# Patient Record
Sex: Female | Born: 1950 | Race: Black or African American | Hispanic: No | Marital: Married | Smoking: Current some day smoker
Health system: Southern US, Community
[De-identification: ages and names within clinical notes are randomized; demographics above are authoritative.]

## PROBLEM LIST (undated history)

## (undated) DIAGNOSIS — M1990 Unspecified osteoarthritis, unspecified site: Secondary | ICD-10-CM

## (undated) DIAGNOSIS — G629 Polyneuropathy, unspecified: Secondary | ICD-10-CM

## (undated) DIAGNOSIS — I739 Peripheral vascular disease, unspecified: Secondary | ICD-10-CM

## (undated) DIAGNOSIS — J449 Chronic obstructive pulmonary disease, unspecified: Secondary | ICD-10-CM

## (undated) DIAGNOSIS — M79609 Pain in unspecified limb: Secondary | ICD-10-CM

## (undated) HISTORY — PX: ABDOMINAL HYSTERECTOMY: SHX81

## (undated) HISTORY — DX: Chronic obstructive pulmonary disease, unspecified: J44.9

## (undated) HISTORY — DX: Pain in unspecified limb: M79.609

## (undated) HISTORY — DX: Polyneuropathy, unspecified: G62.9

## (undated) HISTORY — DX: Unspecified osteoarthritis, unspecified site: M19.90

## (undated) HISTORY — PX: OTHER SURGICAL HISTORY: SHX169

## (undated) HISTORY — DX: Peripheral vascular disease, unspecified: I73.9

## (undated) HISTORY — PX: SPINE SURGERY: SHX786

---

## 2007-08-14 HISTORY — PX: BACK SURGERY: SHX140

## 2011-10-30 ENCOUNTER — Encounter: Payer: Self-pay | Admitting: Vascular Surgery

## 2011-11-01 ENCOUNTER — Encounter: Payer: Self-pay | Admitting: Vascular Surgery

## 2011-11-02 ENCOUNTER — Ambulatory Visit (INDEPENDENT_AMBULATORY_CARE_PROVIDER_SITE_OTHER): Payer: Medicare Other | Admitting: Vascular Surgery

## 2011-11-02 ENCOUNTER — Encounter: Payer: Self-pay | Admitting: Vascular Surgery

## 2011-11-02 VITALS — BP 182/74 | HR 81 | Resp 20 | Ht 66.0 in | Wt 257.0 lb

## 2011-11-02 DIAGNOSIS — I999 Unspecified disorder of circulatory system: Secondary | ICD-10-CM

## 2011-11-02 DIAGNOSIS — I998 Other disorder of circulatory system: Secondary | ICD-10-CM

## 2011-11-02 DIAGNOSIS — I70229 Atherosclerosis of native arteries of extremities with rest pain, unspecified extremity: Secondary | ICD-10-CM | POA: Insufficient documentation

## 2011-11-02 NOTE — Progress Notes (Signed)
[facility] [place]  Referred by:  Abran Richard, DPM [address] [place], [place] [address]  Reason for referral: Left foot pain  History of Present Illness  Gwendolyn Grimes is a 61 y.o. (1951/07/24) female who presents with chief complaint: Left foot pain.  Onset of symptom occurred 2-3 months.  Pain is described as vague, severity 6-9/10, and worsened with movement and improved with foot dangling.  Patient has attempted to treat this pain with OTC rx.  The patient has rest pain symptoms also and no leg wounds/ulcers.  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  Past Medical History  Diagnosis Date  . Peripheral vascular disease   . Pain in limb   . Arthritis     gout  . Neuropathy   . Diabetes mellitus     Past Surgical History  Procedure Date  . Spine surgery   . Abdominal hysterectomy     History   Social History  . Marital Status: Single    Spouse Name: N/A    Number of Children: N/A  . Years of Education: N/A   Occupational History  . Not on file.   Social History Main Topics  . Smoking status: Never Smoker   . Smokeless tobacco: Never Used  . Alcohol Use: No  . Drug Use: No  . Sexually Active: Not on file   Other Topics Concern  . Not on file   Social History Narrative  . No narrative on file    Family History  Problem Relation Age of Onset  . Diabetes Other   . Cancer Other   . Heart disease Other   . Diabetes Mother   . Hypertension Mother   . Cancer Father     PROSTATE  . Diabetes Sister     Current Outpatient Prescriptions on File Prior to Visit  Medication Sig Dispense Refill  . GLYBURIDE-METFORMIN PO Take by mouth.      . insulin aspart (NOVOLOG) 100 UNIT/ML injection Inject into the skin 3 (three) times daily before meals.      . insulin detemir (LEVEMIR) 100 UNIT/ML injection Inject into the skin at bedtime.      . potassium chloride SA (K-DUR,KLOR-CON) 20 MEQ tablet Take 20 mEq by mouth 2 (two) times daily.        . ranitidine (ZANTAC) 150 MG capsule Take 150 mg by mouth 2 (two) times daily.        Allergies  Allergen Reactions  . Penicillins      Review of Systems (Positive items checked otherwise negative)  General: [ ]  Weight loss, [ ]  Weight gain, [ ]   Loss of appetite, [ ]  Fever  Neurologic: [x Dizziness, [ ]  Blackouts, [ ]  Headaches, [ ]  Seizure, [x]  temporary loss of vision in one eye  Ear/Nose/Throat: [ ]  Change in eyesight, [ ]  Change in hearing, [ ]  Nose bleeds, [ ]  Sore throat  Vascular: [x]  Pain in legs with walking, [x]  Pain in feet while lying flat, [ ]  Non-healing ulcer, Stroke, [ ]  "Mini stroke", [ ]  Slurred speech, [ ]  Temporary blindness, [ ]  Blood clot in vein, [ ]  Phlebitis, [x]  leg swelling  Pulmonary: [ ]  Home oxygen, [ ]  Productive cough, [ ]  Bronchitis, [ ]  Coughing up blood, [x] Asthma, [ ]  Wheezing  Musculoskeletal: [ ]  Arthritis, [ ]  Joint pain, [ ]  Muscle pain  Cardiac: [ ]  Chest pain, [ ]  Chest tightness/pressure, [x]  Shortness of breath when lying flat, [ ]  Shortness  of breath with exertion, [ ]  Palpitations, [ ]  Heart murmur, [ ]  Arrythmia,  [ ]  Atrial fibrillation  Hematologic: [ ]  Bleeding problems, [ ]  Clotting disorder, [ ]  Anemia  Psychiatric:  [ ]  Depression, [ ]  Anxiety, [ ]  Attention deficit disorder  Gastrointestinal:  [ ]  Black stool,[ ]   Blood in stool, [ ]  Peptic ulcer disease, [ ]  Reflux,  ] Hiatal hernia, [ ]  Trouble swallowing, [ ]  Diarrhea, [ ]  Constipation  Urinary:  [ ]  Kidney disease, [ ]  Burning with urination, [ ]  Frequent urination, [ ]  Difficulty urinating  Skin: [ ]  Ulcers, [ ]  Rashes   Physical Examination  Filed Vitals:   11/02/11 1420  BP: 182/74  Pulse: 81  Resp: 20  Height: 5\' 6"  (1.676 m)  Weight: 257 lb (116.574 kg)   Body mass index is 41.48 kg/(m^2).  General: A&O x 3, WDWN, morbidly obese  Head: [place]/AT  Ear/Nose/Throat: Hearing grossly intact, nares w/o erythema or drainage, oropharynx w/o  Erythema/Exudate  Eyes: PERRLA, EOMI  Neck: Supple, no nuchal rigidity, no palpable LAD  Pulmonary: Sym exp, good air movt, CTAB, no rales, rhonchi, & wheezing  Cardiac: RRR, Nl S1, S2, no Murmurs, rubs or gallops  Vascular: Vessel Right Left  Radial Palpable Palpable  Brachial Palpable Palpable  Carotid Palpable, without bruit Palpable, without bruit  Aorta Non-palpable N/A  Femoral Palpable Palpable  Popliteal Non-palpable Non-palpable  PT Palpable Non-palpable  DP Palpable Non-Palpable   Gastrointestinal: soft, NTND, -G/R, - HSM, - masses, - CVAT B  Musculoskeletal: M/S 5/5 throughout , Extremities without ischemic changes   Neurologic: CN 2-12 intact , Pain and light touch intact in extremities , Motor exam as listed above  Psychiatric: Judgment intact, Mood & affect appropriatefor pt's clinical situation  Dermatologic: See M/S exam for extremity exam, no rashes otherwise noted  Lymph : No Cervical, Axillary, or Inguinal lymphadenopathy   Non-Invasive Vascular Imaging  OSH ABI (Date: 10/05/11)  RLE: 1.07, PT: triphasic, DP: biphasic  LLE: 0.56, PT: monophasic, DP: monophasic   Outside Studies/Documentation 10 pages of outside documents were reviewed including: outpatient work-up and OSH BLE ABI and physiologic studies  Medical Decision Making  Gwendolyn Grimes is a 61 y.o. female who presents with: LLE critical limb ischemia.   I discussed with the patient the natural history of critical limb ischemia: 25% require amputation in one year, 50% are able to maintain their limbs in one year, and 25-30% die in one year due to comorbidities.  Given the limb threatening status of this patient, I recommend an aggressive work up including proceeding with an: Aortogram, Bilateral runoff and intervention. I discussed with the patient the nature of angiographic procedures, especially the limited patencies of any endovascular intervention. The patient is aware of that the  risks of an angiographic procedure include but are not limited to: bleeding, infection, access site complications, embolization, rupture of treated vessel, dissection, possible need for emergent surgical intervention, and possible need for surgical procedures to treat the patient's pathology. The patient is aware of the risks and agrees to proceed.  The procedure is scheduled for: 4 APR 13.  I discussed in depth with the patient the nature of atherosclerosis, and emphasized the importance of maximal medical management including strict control of blood pressure, blood glucose, and lipid levels, antiplatelet agents, obtaining regular exercise, and cessation of smoking.  The patient is aware that without maximal medical management the underlying atherosclerotic disease process will progress, limiting the benefit of any  interventions.  Thank you for allowing [place] to participate in this patient's care.  Leonides Sake, MD [facility] [place] Office: [phone] Pager: [phone]  11/02/2011, 5:22 PM

## 2011-11-06 ENCOUNTER — Encounter (HOSPITAL_COMMUNITY): Payer: Self-pay | Admitting: Pharmacy Technician

## 2011-11-08 ENCOUNTER — Other Ambulatory Visit: Payer: Self-pay

## 2011-11-14 MED ORDER — SODIUM CHLORIDE 0.9 % IV SOLN
INTRAVENOUS | Status: DC
  Administered 2011-11-15: 08:00:00 via INTRAVENOUS

## 2011-11-15 ENCOUNTER — Encounter (HOSPITAL_COMMUNITY)
Admission: RE | Disposition: A | Discharge status: Home / Self Care | Payer: Self-pay | Source: Ambulatory Visit | Attending: Vascular Surgery

## 2011-11-15 ENCOUNTER — Other Ambulatory Visit: Payer: Self-pay

## 2011-11-15 ENCOUNTER — Ambulatory Visit (HOSPITAL_COMMUNITY)
Admission: RE | Admit: 2011-11-15 | Discharge: 2011-11-15 | Disposition: A | Discharge status: Home / Self Care | Payer: PRIVATE HEALTH INSURANCE | Source: Ambulatory Visit | Attending: Vascular Surgery | Admitting: Vascular Surgery

## 2011-11-15 DIAGNOSIS — I70229 Atherosclerosis of native arteries of extremities with rest pain, unspecified extremity: Secondary | ICD-10-CM | POA: Insufficient documentation

## 2011-11-15 DIAGNOSIS — L98499 Non-pressure chronic ulcer of skin of other sites with unspecified severity: Principal | ICD-10-CM | POA: Diagnosis present

## 2011-11-15 DIAGNOSIS — Z794 Long term (current) use of insulin: Secondary | ICD-10-CM

## 2011-11-15 DIAGNOSIS — E1149 Type 2 diabetes mellitus with other diabetic neurological complication: Secondary | ICD-10-CM | POA: Diagnosis present

## 2011-11-15 DIAGNOSIS — I739 Peripheral vascular disease, unspecified: Principal | ICD-10-CM | POA: Diagnosis present

## 2011-11-15 DIAGNOSIS — E1142 Type 2 diabetes mellitus with diabetic polyneuropathy: Secondary | ICD-10-CM | POA: Insufficient documentation

## 2011-11-15 DIAGNOSIS — I70219 Atherosclerosis of native arteries of extremities with intermittent claudication, unspecified extremity: Secondary | ICD-10-CM

## 2011-11-15 DIAGNOSIS — G589 Mononeuropathy, unspecified: Secondary | ICD-10-CM | POA: Diagnosis present

## 2011-11-15 DIAGNOSIS — Z7982 Long term (current) use of aspirin: Secondary | ICD-10-CM

## 2011-11-15 DIAGNOSIS — Z833 Family history of diabetes mellitus: Secondary | ICD-10-CM

## 2011-11-15 DIAGNOSIS — L97809 Non-pressure chronic ulcer of other part of unspecified lower leg with unspecified severity: Secondary | ICD-10-CM | POA: Diagnosis present

## 2011-11-15 HISTORY — PX: ABDOMINAL AORTAGRAM: SHX5454

## 2011-11-15 LAB — POCT I-STAT, CHEM 8
BUN: 5 mg/dL — ABNORMAL LOW (ref 6–23)
Creatinine, Ser: 0.7 mg/dL (ref 0.50–1.10)
Glucose, Bld: 231 mg/dL — ABNORMAL HIGH (ref 70–99)
Hemoglobin: 12.9 g/dL (ref 12.0–15.0)
Sodium: 140 mEq/L (ref 135–145)
TCO2: 26 mmol/L (ref 0–100)

## 2011-11-15 LAB — POCT ACTIVATED CLOTTING TIME: Activated Clotting Time: 215 seconds

## 2011-11-15 LAB — GLUCOSE, CAPILLARY: Glucose-Capillary: 184 mg/dL — ABNORMAL HIGH (ref 70–99)

## 2011-11-15 SURGERY — ABDOMINAL AORTAGRAM
Anesthesia: LOCAL

## 2011-11-15 MED ORDER — HEPARIN SODIUM (PORCINE) 1000 UNIT/ML IJ SOLN
INTRAMUSCULAR | Status: AC
  Filled 2011-11-15: qty 1

## 2011-11-15 MED ORDER — FENTANYL CITRATE 0.05 MG/ML IJ SOLN
INTRAMUSCULAR | Status: AC
  Filled 2011-11-15: qty 2

## 2011-11-15 MED ORDER — ONDANSETRON HCL 4 MG/2ML IJ SOLN: 4.0000 mg | Freq: Four times a day (QID) | INTRAMUSCULAR | Status: DC | PRN

## 2011-11-15 MED ORDER — OXYCODONE-ACETAMINOPHEN 5-325 MG PO TABS
1.0000 | ORAL_TABLET | ORAL | Status: DC | PRN
  Administered 2011-11-15: 2 via ORAL
  Filled 2011-11-15: qty 2

## 2011-11-15 MED ORDER — HEPARIN (PORCINE) IN NACL 2-0.9 UNIT/ML-% IJ SOLN
INTRAMUSCULAR | Status: AC
  Filled 2011-11-15: qty 1000

## 2011-11-15 MED ORDER — SODIUM CHLORIDE 0.9 % IV SOLN: INTRAVENOUS | Status: DC

## 2011-11-15 MED ORDER — MIDAZOLAM HCL 2 MG/2ML IJ SOLN
INTRAMUSCULAR | Status: AC
  Filled 2011-11-15: qty 2

## 2011-11-15 MED ORDER — LIDOCAINE HCL (PF) 1 % IJ SOLN
INTRAMUSCULAR | Status: AC
  Filled 2011-11-15: qty 30

## 2011-11-15 MED ORDER — MORPHINE SULFATE 4 MG/ML IJ SOLN
2.0000 mg | INTRAMUSCULAR | Status: DC | PRN
  Administered 2011-11-15 (×2): 2 mg via INTRAVENOUS
  Filled 2011-11-15: qty 1

## 2011-11-15 MED ORDER — MORPHINE SULFATE 4 MG/ML IJ SOLN
INTRAMUSCULAR | Status: AC
  Filled 2011-11-15: qty 1

## 2011-11-15 MED ORDER — ACETAMINOPHEN 325 MG PO TABS: 650.0000 mg | ORAL_TABLET | ORAL | Status: DC | PRN

## 2011-11-15 NOTE — Interval H&P Note (Signed)
[facility] [place]  History and Physical Update  The patient was interviewed and re-examined.  The patient's previous History and Physical has been reviewed and is unchanged.  There is no change in the plan of care.  Leonides Sake, MD [facility] [place] Office: [phone] Pager: [phone]  11/15/2011, 7:10 AM

## 2011-11-15 NOTE — H&P (View-Only) (Signed)
[facility] [place]  Referred by:  Abran Richard, DPM [address] [place], [place] [address]  Reason for referral: Left foot pain  History of Present Illness  Gwendolyn Grimes is a 61 y.o. (11/02/1950) female who presents with chief complaint: Left foot pain.  Onset of symptom occurred 2-3 months.  Pain is described as vague, severity 6-9/10, and worsened with movement and improved with foot dangling.  Patient has attempted to treat this pain with OTC rx.  The patient has rest pain symptoms also and no leg wounds/ulcers.  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  Past Medical History  Diagnosis Date  . Peripheral vascular disease   . Pain in limb   . Arthritis     gout  . Neuropathy   . Diabetes mellitus     Past Surgical History  Procedure Date  . Spine surgery   . Abdominal hysterectomy     History   Social History  . Marital Status: Single    Spouse Name: N/A    Number of Children: N/A  . Years of Education: N/A   Occupational History  . Not on file.   Social History Main Topics  . Smoking status: Never Smoker   . Smokeless tobacco: Never Used  . Alcohol Use: No  . Drug Use: No  . Sexually Active: Not on file   Other Topics Concern  . Not on file   Social History Narrative  . No narrative on file    Family History  Problem Relation Age of Onset  . Diabetes Other   . Cancer Other   . Heart disease Other   . Diabetes Mother   . Hypertension Mother   . Cancer Father     PROSTATE  . Diabetes Sister     Current Outpatient Prescriptions on File Prior to Visit  Medication Sig Dispense Refill  . GLYBURIDE-METFORMIN PO Take by mouth.      . insulin aspart (NOVOLOG) 100 UNIT/ML injection Inject into the skin 3 (three) times daily before meals.      . insulin detemir (LEVEMIR) 100 UNIT/ML injection Inject into the skin at bedtime.      . potassium chloride SA (K-DUR,KLOR-CON) 20 MEQ tablet Take 20 mEq by mouth 2 (two) times daily.        . ranitidine (ZANTAC) 150 MG capsule Take 150 mg by mouth 2 (two) times daily.        Allergies  Allergen Reactions  . Penicillins      Review of Systems (Positive items checked otherwise negative)  General: [ ]  Weight loss, [ ]  Weight gain, [ ]   Loss of appetite, [ ]  Fever  Neurologic: [x Dizziness, [ ]  Blackouts, [ ]  Headaches, [ ]  Seizure, [x]  temporary loss of vision in one eye  Ear/Nose/Throat: [ ]  Change in eyesight, [ ]  Change in hearing, [ ]  Nose bleeds, [ ]  Sore throat  Vascular: [x]  Pain in legs with walking, [x]  Pain in feet while lying flat, [ ]  Non-healing ulcer, Stroke, [ ]  "Mini stroke", [ ]  Slurred speech, [ ]  Temporary blindness, [ ]  Blood clot in vein, [ ]  Phlebitis, [x]  leg swelling  Pulmonary: [ ]  Home oxygen, [ ]  Productive cough, [ ]  Bronchitis, [ ]  Coughing up blood, [x] Asthma, [ ]  Wheezing  Musculoskeletal: [ ]  Arthritis, [ ]  Joint pain, [ ]  Muscle pain  Cardiac: [ ]  Chest pain, [ ]  Chest tightness/pressure, [x]  Shortness of breath when lying flat, [ ]  Shortness  of breath with exertion, [ ]  Palpitations, [ ]  Heart murmur, [ ]  Arrythmia,  [ ]  Atrial fibrillation  Hematologic: [ ]  Bleeding problems, [ ]  Clotting disorder, [ ]  Anemia  Psychiatric:  [ ]  Depression, [ ]  Anxiety, [ ]  Attention deficit disorder  Gastrointestinal:  [ ]  Black stool,[ ]   Blood in stool, [ ]  Peptic ulcer disease, [ ]  Reflux,  ] Hiatal hernia, [ ]  Trouble swallowing, [ ]  Diarrhea, [ ]  Constipation  Urinary:  [ ]  Kidney disease, [ ]  Burning with urination, [ ]  Frequent urination, [ ]  Difficulty urinating  Skin: [ ]  Ulcers, [ ]  Rashes   Physical Examination  Filed Vitals:   11/02/11 1420  BP: 182/74  Pulse: 81  Resp: 20  Height: 5\' 6"  (1.676 m)  Weight: 257 lb (116.574 kg)   Body mass index is 41.48 kg/(m^2).  General: A&O x 3, WDWN, morbidly obese  Head: [place]/AT  Ear/Nose/Throat: Hearing grossly intact, nares w/o erythema or drainage, oropharynx w/o  Erythema/Exudate  Eyes: PERRLA, EOMI  Neck: Supple, no nuchal rigidity, no palpable LAD  Pulmonary: Sym exp, good air movt, CTAB, no rales, rhonchi, & wheezing  Cardiac: RRR, Nl S1, S2, no Murmurs, rubs or gallops  Vascular: Vessel Right Left  Radial Palpable Palpable  Brachial Palpable Palpable  Carotid Palpable, without bruit Palpable, without bruit  Aorta Non-palpable N/A  Femoral Palpable Palpable  Popliteal Non-palpable Non-palpable  PT Palpable Non-palpable  DP Palpable Non-Palpable   Gastrointestinal: soft, NTND, -G/R, - HSM, - masses, - CVAT B  Musculoskeletal: M/S 5/5 throughout , Extremities without ischemic changes   Neurologic: CN 2-12 intact , Pain and light touch intact in extremities , Motor exam as listed above  Psychiatric: Judgment intact, Mood & affect appropriatefor pt's clinical situation  Dermatologic: See M/S exam for extremity exam, no rashes otherwise noted  Lymph : No Cervical, Axillary, or Inguinal lymphadenopathy   Non-Invasive Vascular Imaging  OSH ABI (Date: 10/05/11)  RLE: 1.07, PT: triphasic, DP: biphasic  LLE: 0.56, PT: monophasic, DP: monophasic   Outside Studies/Documentation 10 pages of outside documents were reviewed including: outpatient work-up and OSH BLE ABI and physiologic studies  Medical Decision Making  Gwendolyn Grimes is a 61 y.o. female who presents with: LLE critical limb ischemia.   I discussed with the patient the natural history of critical limb ischemia: 25% require amputation in one year, 50% are able to maintain their limbs in one year, and 25-30% die in one year due to comorbidities.  Given the limb threatening status of this patient, I recommend an aggressive work up including proceeding with an: Aortogram, Bilateral runoff and intervention. I discussed with the patient the nature of angiographic procedures, especially the limited patencies of any endovascular intervention. The patient is aware of that the  risks of an angiographic procedure include but are not limited to: bleeding, infection, access site complications, embolization, rupture of treated vessel, dissection, possible need for emergent surgical intervention, and possible need for surgical procedures to treat the patient's pathology. The patient is aware of the risks and agrees to proceed.  The procedure is scheduled for: 4 APR 13.  I discussed in depth with the patient the nature of atherosclerosis, and emphasized the importance of maximal medical management including strict control of blood pressure, blood glucose, and lipid levels, antiplatelet agents, obtaining regular exercise, and cessation of smoking.  The patient is aware that without maximal medical management the underlying atherosclerotic disease process will progress, limiting the benefit of any  interventions.  Thank you for allowing [place] to participate in this patient's care.  Leonides Sake, MD [facility] [place] Office: [phone] Pager: [phone]  11/02/2011, 5:22 PM

## 2011-11-15 NOTE — Op Note (Signed)
OPERATIVE NOTE   PROCEDURE: 1.  Right common femoral artery cannulation under ultrasound guidance 2.  Aortogram 3.  Bilateral leg runoff 4.  Third order arterial selection 5.  Left superficial femoral artery angioplasty x 2 (4 mm x 120 mm, 5 mm x 20 mm)  PRE-OPERATIVE DIAGNOSIS: Left leg rest pain  POST-OPERATIVE DIAGNOSIS: same as above   SURGEON: Leonides Sake, MD  ANESTHESIA: conscious sedation  ESTIMATED BLOOD LOSS: 50 cc  CONTRAST: 215 cc  FINDING(S):  Aorta: patent  Superior mesenteric artery: patent Celiac artery: not visualized  Right Left  RA Patent, nephrogram present Patent, nephrogram present  CIA Patent Patent  EIA Patent Patent  IIA Patent Patent  CFA Patent Patent  SFA Patent Near occlusion 11 cm after femoral bifurcation: resolved after angioplasty.  Possible small non-flow limiting dissection proximally  PFA Patent Patent  Pop Patent Patent  Trif Patent Patent  AT Patent Patent  Pero Patent Patent  PT Patent: dominant runoff Patent: dominant runoff  B feet: with 3 vessel runoff  SPECIMEN(S):  none  INDICATIONS:   Gwendolyn Grimes is a 61 y.o. female who presents with left leg rest pain.  I recommended we proceed forward with aortogram, bilateral leg runoff, and possible intervention given her possible limb threatening status.  I discussed with the patient the nature of angiographic procedures, especially the limited patencies of any endovascular intervention.  The patient is aware of that the risks of an angiographic procedure include but are not limited to: bleeding, infection, access site complications, renal failure, embolization, rupture of vessel, dissection, possible need for emergent surgical intervention, possible need for surgical procedures to treat the patient's pathology, and stroke and death.  The patient is aware of the risks and agrees to proceed.  DESCRIPTION: After full informed consent was obtained from the patient, the patient was  brought back to the angiography suite.  The patient was placed supine upon the angiography table and connected to monitoring equipment.  The patient was then given conscious sedation, the amounts of which are documented in the patient's chart.  The patient was prepped and drape in the standard fashion for an angiographic procedure.  At this point, attention was turned to the right groin.  Under ultrasound guidance, the right common femoral artery was cannulated with a micropuncture needle.  The microwire was advanced into the iliac arterial system.  The needle was exchanged for a microsheath, which was loaded into the common femoral artery over the wire.  The microwire was exchanged for a [facility] wire which was advanced into the aorta.  The microsheath was then exchanged for a 5-Fr sheath which was loaded into the common femoral artery.  The Omniflush catheter was then loaded over the wire up to the level of L1.  The catheter was connected to the power injector circuit.  After de-airring and de-clotting the circuit, a power injector aortogram was completed.  The findings are listed above.  The catheter was then pulled down to just proximal to the aortic bifurcation.  An automated bilateral leg runoff was completed.  The findings are as list above.  On the runoff, there appears to be a flush occlusion of the left superficial femoral artery but there is delayed reconstitution, so a LAO leg injection was completed.  This demonstrated a patent proximal superficial femoral artery with a very high grade stenosis within the left superficial femoral artery.  Based on these images, I felt intervention was worth attempting.  The [facility] wire was replaced in  the catheter, and using the [facility] and Omniflush catheter, the left common iliac artery was selected.  The catheter and wire were advanced into the external iliac artery.  The wire was exchanged for a Rosen wire.  The patient's left femoral sheath was exchanged for a 6-Fr  Ansel sheath, which was lodged in the left common femoral artery.  The dilator was removed.  The patient was given 8000 units of Heparin intravenously, and later was given another 2000 units of Heparin.  The wire was advanced into the superficial femoral artery.  Using the Omniflush catheter, the wire was exchanged for a Wholey wire, which was advanced into the above-the-knee popliteal artery without difficulty.  I obtained a 3 mm x 120 mm balloon and angioplastied from the femoral bifurcation down at 10 atm for 1 minute.  I then deflated the balloon and angioplastied the rest of the left superficial femoral artery.  Hand injections demonstrated improvement but still some contrast delay.  I reangioplastied the proximal 20 cm of the left superficial femoral artery at 10 atm for 1 minute.  The completion angiogram demonstrates improvement in the contrast drainage but a residual stenosis at 11 cm after femoral bifurcation.  I angioplastied this stenosis with a 5 mm x 20 mm balloon at 10 atm for 1 minute.  I also reangioplastied the superficial femoral artery orifice at 10 atm for 1 minute.  With hand injections, I verified there was no embolization and there was <30% residual stenosis throughout the superficial femoral artery.  There appears to be a small possible non-flow limiting dissection in the proximal superficial femoral artery.  I did not think a stent was indicated, so I will allow this superficial femoral artery to heal and reinterrogate the left superficial femoral artery with an arterial duplex on follow up in the office.  I pulled out the wire and pulled the sheath back to the right external iliac artery.  The sheath was aspirated.  No clots were present and the sheath was reloaded with heparinized saline.  Once the patient's ACT normalizes, the sheath will be pulled in the holding area.  COMPLICATIONS: none  CONDITION: stable   Leonides Sake, MD [facility] [place] Office:  [phone] Pager: [phone]  11/15/2011, 12:09 PM

## 2011-11-15 NOTE — Discharge Instructions (Addendum)
Do not resume metformin for 48 hours   Groin Site Care Refer to this sheet in the next few weeks. These instructions provide you with information on caring for yourself after your procedure. Your caregiver may also give you more specific instructions. Your treatment has been planned according to current medical practices, but problems sometimes occur. Call your caregiver if you have any problems or questions after your procedure. HOME CARE INSTRUCTIONS  You may shower 24 hours after the procedure. Remove the bandage (dressing) and gently wash the site with plain soap and water. Gently pat the site dry.   Do not apply powder or lotion to the site.   Do not sit in a bathtub, swimming pool, or whirlpool for 5 to 7 days.   No bending, squatting, or lifting anything over 10 pounds (4.5 kg) as directed by your caregiver.   Inspect the site at least twice daily.   Do not drive home if you are discharged the same day of the procedure. Have someone else drive you.   You may drive 24 hours after the procedure unless otherwise instructed by your caregiver.  What to expect:  Any bruising will usually fade within 1 to 2 weeks.   Blood that collects in the tissue (hematoma) may be painful to the touch. It should usually decrease in size and tenderness within 1 to 2 weeks.  SEEK IMMEDIATE MEDICAL CARE IF:  You have unusual pain at the groin site or down the affected leg.   You have redness, warmth, swelling, or pain at the groin site.   You have drainage (other than a small amount of blood on the dressing).   You have chills.   You have a fever or persistent symptoms for more than 72 hours.   You have a fever and your symptoms suddenly get worse.   Your leg becomes pale, cool, tingly, or numb.   You have heavy bleeding from the site. Hold pressure on the site.  Document Released: 09/01/2010 Document Revised: 07/19/2011 Document Reviewed: 09/01/2010 [facility] Patient Information 2012  [place], [place].

## 2011-11-16 ENCOUNTER — Other Ambulatory Visit: Payer: Self-pay

## 2011-11-16 ENCOUNTER — Telehealth: Payer: Self-pay

## 2011-11-16 ENCOUNTER — Other Ambulatory Visit: Payer: Self-pay | Admitting: *Deleted

## 2011-11-16 DIAGNOSIS — I70219 Atherosclerosis of native arteries of extremities with intermittent claudication, unspecified extremity: Secondary | ICD-10-CM

## 2011-11-16 DIAGNOSIS — I998 Other disorder of circulatory system: Secondary | ICD-10-CM

## 2011-11-16 NOTE — Telephone Encounter (Signed)
Pt. Called to report increased discoloration of 2nd and 5th toes of left foot, and c/o "burning pain" at rest.  States her 2nd toe was discolored prior to aortogram of 4/4, but that the color has gotten darker.  Also states that the small toe wasn't discolored, and now is turning dark.  Notes that the great toe color looks a little better, and she notes that the bottom of left foot is warmer than prior to procedure yesterday.  States her toes feel stiff, and can hardly move them.  States the burning pain is a change in her symptoms compared to before procedure.  States left leg swelling has improved, and denies any specific symptoms with the left leg.  C/o all the change in color and pain within the toe area of left foot.  Discussed with Dr. Imogene Burn.  Recommends pt. come in today for left lower extremity arterial duplex.  Notified pt.  States unsure if she can arrange transportation.  Encouraged pt. To make strong effort to come to office today.  Verb. Understanding.

## 2011-11-18 ENCOUNTER — Other Ambulatory Visit: Payer: Self-pay

## 2011-11-18 ENCOUNTER — Inpatient Hospital Stay (HOSPITAL_COMMUNITY)
Admission: EM | Admit: 2011-11-18 | Discharge: 2011-11-21 | DRG: 254 | Disposition: A | Discharge status: Home / Self Care | Payer: PRIVATE HEALTH INSURANCE | Attending: Vascular Surgery | Admitting: Vascular Surgery

## 2011-11-18 ENCOUNTER — Inpatient Hospital Stay (HOSPITAL_COMMUNITY): Payer: PRIVATE HEALTH INSURANCE

## 2011-11-18 ENCOUNTER — Encounter (HOSPITAL_COMMUNITY): Payer: Self-pay

## 2011-11-18 DIAGNOSIS — I70229 Atherosclerosis of native arteries of extremities with rest pain, unspecified extremity: Secondary | ICD-10-CM

## 2011-11-18 DIAGNOSIS — M79609 Pain in unspecified limb: Secondary | ICD-10-CM

## 2011-11-18 DIAGNOSIS — I999 Unspecified disorder of circulatory system: Secondary | ICD-10-CM

## 2011-11-18 DIAGNOSIS — I998 Other disorder of circulatory system: Secondary | ICD-10-CM

## 2011-11-18 DIAGNOSIS — Z01810 Encounter for preprocedural cardiovascular examination: Secondary | ICD-10-CM

## 2011-11-18 HISTORY — PX: PULMONARY EMBOLISM SURGERY: SHX752

## 2011-11-18 LAB — CBC
Hemoglobin: 11.9 g/dL — ABNORMAL LOW (ref 12.0–15.0)
MCH: 25.8 pg — ABNORMAL LOW (ref 26.0–34.0)
MCV: 78.5 fL (ref 78.0–100.0)
RBC: 4.61 MIL/uL (ref 3.87–5.11)

## 2011-11-18 LAB — BASIC METABOLIC PANEL
CO2: 28 mEq/L (ref 19–32)
Calcium: 9.2 mg/dL (ref 8.4–10.5)
Creatinine, Ser: 0.63 mg/dL (ref 0.50–1.10)
Glucose, Bld: 271 mg/dL — ABNORMAL HIGH (ref 70–99)
Sodium: 136 mEq/L (ref 135–145)

## 2011-11-18 LAB — TYPE AND SCREEN

## 2011-11-18 LAB — APTT: aPTT: 31 seconds (ref 24–37)

## 2011-11-18 LAB — PROTIME-INR: INR: 0.98 (ref 0.00–1.49)

## 2011-11-18 LAB — HEPARIN LEVEL (UNFRACTIONATED): Heparin Unfractionated: <0.1 IU/mL — ABNORMAL LOW (ref 0.30–0.70)

## 2011-11-18 MED ORDER — HEPARIN BOLUS VIA INFUSION
2000.0000 [IU] | Freq: Once | INTRAVENOUS | Status: AC
  Administered 2011-11-19: 2000 [IU] via INTRAVENOUS
  Filled 2011-11-18: qty 2000

## 2011-11-18 MED ORDER — MAGNESIUM HYDROXIDE 400 MG/5ML PO SUSP: 30.0000 mL | Freq: Every day | ORAL | Status: DC | PRN

## 2011-11-18 MED ORDER — ACETAMINOPHEN 325 MG PO TABS
325.0000 mg | ORAL_TABLET | ORAL | Status: DC | PRN
Start: 2011-11-18 — End: 2011-11-19

## 2011-11-18 MED ORDER — HEPARIN (PORCINE) IN NACL 100-0.45 UNIT/ML-% IJ SOLN
1600.0000 [IU]/h | INTRAMUSCULAR | Status: DC
  Administered 2011-11-18: 1200 [IU]/h via INTRAVENOUS
  Administered 2011-11-19: 1600 [IU]/h via INTRAVENOUS
  Filled 2011-11-18 (×4): qty 250

## 2011-11-18 MED ORDER — HYDRALAZINE HCL 20 MG/ML IJ SOLN
10.0000 mg | INTRAMUSCULAR | Status: DC | PRN
  Filled 2011-11-18: qty 0.5

## 2011-11-18 MED ORDER — INSULIN ASPART 100 UNIT/ML ~~LOC~~ SOLN
0.0000 [IU] | Freq: Three times a day (TID) | SUBCUTANEOUS | Status: DC
  Administered 2011-11-19: 8 [IU] via SUBCUTANEOUS
  Administered 2011-11-19: 3 [IU] via SUBCUTANEOUS
  Administered 2011-11-20 (×2): 5 [IU] via SUBCUTANEOUS
  Administered 2011-11-20: 3 [IU] via SUBCUTANEOUS
  Administered 2011-11-21: 2 [IU] via SUBCUTANEOUS
  Administered 2011-11-21: 3 [IU] via SUBCUTANEOUS

## 2011-11-18 MED ORDER — INSULIN ASPART 100 UNIT/ML ~~LOC~~ SOLN
0.0000 [IU] | Freq: Every day | SUBCUTANEOUS | Status: DC
  Administered 2011-11-18 – 2011-11-19 (×2): 4 [IU] via SUBCUTANEOUS
  Administered 2011-11-20: 5 [IU] via SUBCUTANEOUS

## 2011-11-18 MED ORDER — OXYCODONE HCL 5 MG PO TABS
5.0000 mg | ORAL_TABLET | ORAL | Status: DC | PRN
  Administered 2011-11-19 – 2011-11-20 (×4): 10 mg via ORAL
  Filled 2011-11-18 (×4): qty 2

## 2011-11-18 MED ORDER — DOCUSATE SODIUM 100 MG PO CAPS
100.0000 mg | ORAL_CAPSULE | Freq: Two times a day (BID) | ORAL | Status: DC
  Administered 2011-11-18 – 2011-11-21 (×4): 100 mg via ORAL
  Filled 2011-11-18 (×7): qty 1

## 2011-11-18 MED ORDER — HEPARIN BOLUS VIA INFUSION
2500.0000 [IU] | Freq: Once | INTRAVENOUS | Status: AC
  Administered 2011-11-18: 2500 [IU] via INTRAVENOUS

## 2011-11-18 MED ORDER — LABETALOL HCL 5 MG/ML IV SOLN
10.0000 mg | INTRAVENOUS | Status: DC | PRN
  Filled 2011-11-18: qty 4

## 2011-11-18 MED ORDER — ONDANSETRON HCL 4 MG/2ML IJ SOLN
4.0000 mg | Freq: Four times a day (QID) | INTRAMUSCULAR | Status: DC | PRN
  Administered 2011-11-19: 4 mg via INTRAVENOUS
  Filled 2011-11-18: qty 2

## 2011-11-18 MED ORDER — OXYCODONE-ACETAMINOPHEN 5-325 MG PO TABS
1.0000 | ORAL_TABLET | Freq: Once | ORAL | Status: DC
  Administered 2011-11-18: 1 via ORAL

## 2011-11-18 MED ORDER — BISACODYL 5 MG PO TBEC: 5.0000 mg | DELAYED_RELEASE_TABLET | Freq: Every day | ORAL | Status: DC | PRN

## 2011-11-18 MED ORDER — MORPHINE SULFATE 2 MG/ML IJ SOLN
2.0000 mg | INTRAMUSCULAR | Status: DC | PRN
  Administered 2011-11-18 – 2011-11-19 (×3): 2 mg via INTRAVENOUS
  Filled 2011-11-18 (×3): qty 1

## 2011-11-18 MED ORDER — OXYCODONE-ACETAMINOPHEN 5-325 MG PO TABS
ORAL_TABLET | ORAL | Status: AC
  Administered 2011-11-18: 1 via ORAL
  Filled 2011-11-18: qty 1

## 2011-11-18 MED ORDER — METOPROLOL TARTRATE 1 MG/ML IV SOLN: 2.0000 mg | INTRAVENOUS | Status: DC | PRN

## 2011-11-18 MED ORDER — SODIUM CHLORIDE 0.9 % IV SOLN
INTRAVENOUS | Status: DC
  Administered 2011-11-18: 15:00:00 via INTRAVENOUS
  Administered 2011-11-19: 75 mL/h via INTRAVENOUS

## 2011-11-18 MED ORDER — ACETAMINOPHEN 325 MG RE SUPP
325.0000 mg | RECTAL | Status: DC | PRN
  Filled 2011-11-18: qty 2

## 2011-11-18 NOTE — ED Provider Notes (Signed)
Medical screening examination/treatment/procedure(s) were conducted as a shared visit with non-physician practitioner(s) and myself.  I personally evaluated the patient during the encounter. 2:53 PM Pt had iliac artery procedure with balloon dilation.  Now has had pain in left foot, some dusky color to the left second toe.  Call to Dr. Leonides Sake, vascular surgeon --> requests that arterial duplex study be done on pt's left leg. Dr. Imogene Burn ultimately admitted pt.  Carleene Cooper III, MD 11/18/11 [phone]

## 2011-11-18 NOTE — ED Notes (Signed)
Pt knows that urine is needed 

## 2011-11-18 NOTE — ED Notes (Signed)
Pt states she had a vascular surgery Thursday on her left foot, Friday her left foot and leg started to become numb and cold. She consulted her Dr and he advised her to come to the ED

## 2011-11-18 NOTE — Progress Notes (Signed)
VASCULAR LAB PRELIMINARY  ARTERIAL  ABI completed:    RIGHT    LEFT    PRESSURE WAVEFORM  PRESSURE WAVEFORM  BRACHIAL 149  T BRACHIAL 146 T  DP   DP    AT 152 M AT 127 DM  PT 162 M PT 130 DM  PER   PER    GREAT TOE  NA GREAT TOE  NA    RIGHT LEFT  ABI >1.0 0.87     Gwendolyn Grimes, 11/18/2011, 3:56 PM

## 2011-11-18 NOTE — H&P (Signed)
[facility] [place]  History of Present Illness  Gwendolyn Grimes is a 61 y.o. ([birth year]) female who presents with chief complaint: color changes and pain in left foot.   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  The patient had resolution of her prior sx until the next day.  At this time, she noted darkening in her toes and pain her toes.  She instructed to come into the office for evaluation but due to transportation issues, she did not do so.  She was also told to go the ER but did not do such.  Pain is described as sharp, severity 3-6/10, and associated with ambulation and rest.  Patient has attempted to treat this pain with OTC pain rx.  The patient has no rest pain symptoms also and new left heel ulceration.  Atherosclerotic risk factors include: diabetes.  Past Medical History  Diagnosis Date  . Peripheral vascular disease   . Pain in limb   . Arthritis     gout  . Neuropathy   . Diabetes mellitus     Past Surgical History  Procedure Date  . Spine surgery   . Abdominal hysterectomy   . Vascular vein     History   Social History  . Marital Status: Single    Spouse Name: N/A    Number of Children: N/A  . Years of Education: N/A   Occupational History  . Not on file.   Social History Main Topics  . Smoking status: Never Smoker   . Smokeless tobacco: Never Used  . Alcohol Use: No  . Drug Use: No  . Sexually Active: Not on file   Other Topics Concern  . Not on file   Social History Narrative  . No narrative on file    Family History  Problem Relation Age of Onset  . Diabetes Other   . Cancer Other   . Heart disease Other   . Diabetes Mother   . Hypertension Mother   . Cancer Father     PROSTATE  . Diabetes Sister     No current facility-administered medications on file prior to encounter.   Current Outpatient Prescriptions on File Prior to Encounter    Medication Sig Dispense Refill  . albuterol (PROVENTIL HFA;VENTOLIN HFA) 108 (90 BASE) MCG/ACT inhaler Inhale 2 puffs into the lungs every 4 (four) hours as needed. For shortness of breath      . aspirin EC 81 MG tablet Take 81 mg by mouth daily.      Marland Kitchen glyBURIDE-metformin (GLUCOVANCE) 5-500 MG per tablet Take 1 tablet by mouth daily with breakfast.      . HYDROcodone-acetaminophen (LORCET) 10-650 MG per tablet Take 1 tablet by mouth every 6 (six) hours as needed. As needed for pain.      Marland Kitchen insulin aspart (NOVOLOG) 100 UNIT/ML injection Inject 20 Units into the skin daily.       . insulin detemir (LEVEMIR) 100 UNIT/ML injection Inject 50 Units into the skin at bedtime.       . Multiple Vitamin (MULITIVITAMIN WITH MINERALS) TABS Take 1 tablet by mouth daily.      . potassium chloride SA (K-DUR,KLOR-CON) 20 MEQ tablet Take 40 mEq by mouth daily.       . ranitidine (ZANTAC) 150 MG capsule Take 150 mg by mouth daily as needed. As needed for acid reflux.       Allergies  Allergen Reactions  . Penicillins Hives and Itching   Review of Systems (Positive items checked otherwise negative)   General: [ ]  Weight loss, [ ]  Weight gain, [ ]  Loss of appetite, [ ]  Fever   Neurologic: [x Dizziness, [ ]  Blackouts, [ ]  Headaches, [ ]  Seizure, [x]  temporary loss of vision in one eye   Ear/Nose/Throat: [ ]  Change in eyesight, [ ]  Change in hearing, [ ]  Nose bleeds, [ ]  Sore throat   Vascular: [x]  Pain in legs with walking, [x]  Pain in feet while lying flat, [ ]  Non-healing ulcer, [ ]  Stroke, [ ]  "Mini stroke", [ ]  Slurred speech, [ ]  Temporary blindness, [ ]  Blood clot in vein, [ ]  Phlebitis, [x]  leg swelling   Pulmonary: [ ]  Home oxygen, [ ]  Productive cough, [ ]  Bronchitis, [ ]  Coughing up blood, [x] Asthma, [ ]  Wheezing   Musculoskeletal: [ ]  Arthritis, [ ]  Joint pain, [ ]  Muscle pain   Cardiac: [ ]  Chest pain, [ ]  Chest tightness/pressure, [x]  Shortness of breath when lying flat, [ ]  Shortness of  breath with exertion, [ ]  Palpitations, [ ]  Heart murmur, [ ]  Arrythmia, [ ]  Atrial fibrillation   Hematologic: [ ]  Bleeding problems, [ ]  Clotting disorder, [ ]  Anemia   Psychiatric: [ ]  Depression, [ ]  Anxiety, [ ]  Attention deficit disorder   Gastrointestinal: [ ]  Black stool,[ ]  Blood in stool, [ ]  Peptic ulcer disease, [ ]  Reflux, ] Hiatal hernia, [ ]  Trouble swallowing, [ ]  Diarrhea, [ ]  Constipation   Urinary: [ ]  Kidney disease, [ ]  Burning with urination, [ ]  Frequent urination, [ ]  Difficulty urinating   Skin: [ ]  Ulcers, [ ]  Rashes  Physical Examination  Filed Vitals:   11/18/11 1026  BP: 144/64  Pulse: 103  Resp: 20  SpO2: 99%   There is no height or weight on file to calculate BMI.  General: A&O x 3, WDWN, obese  Head: [place]/AT  Ear/Nose/Throat: Hearing grossly intact, nares w/o erythema or drainage, oropharynx w/o Erythema/Exudate  Eyes: PERRLA, EOMI  Neck: Supple, no nuchal rigidity, no palpable LAD  Pulmonary: Sym exp, good air movt, CTAB, no rales, rhonchi, & wheezing  Cardiac: RRR, Nl S1, S2, no Murmurs, rubs or gallops  Vascular: Vessel Right Left  Radial Palpable Palpable  Brachial Palpable Palpable  Carotid Palpable, without bruit Palpable, without bruit  Aorta Non-palpable N/A  Femoral Palpable Palpable  Popliteal Non-palpable Non-palpable  PT Palpable Non-Palpable, easily dopplerable  DP Palpable Non-Palpable, easily dopplerable   Gastrointestinal: soft, NTND, -G/R, - HSM, - masses, - CVAT B  Musculoskeletal: M/S 5/5 throughout except decreased DF/PF on left due to pain with such, Extremities without ischemic changes except  Left foot: dusky 2nd toe, ischemic appearing heel; warm L foot  Neurologic: CN 2-12 intact , Pain and light touch intact in extremities including left foot, Motor exam as listed above  Psychiatric: Judgment intact, Mood & affect appropriatefor pt's clinical situation  Dermatologic: See M/S exam for extremity exam, no  rashes otherwise noted  Lymph : No Cervical, Axillary, or Inguinal lymphadenopathy   Medical Decision Making  Gwendolyn Grimes is a 61 y.o. female who presents with: possible embolic phenomena after L SFA angioplasty   I don't recall any heel ulceration previously, and the history is concerning for embolism.  I also found a small non-flow limited dissection at the end of the case, so I am concerned this may have worsened.  I recommended to the patient  admission, pain control, IV heparin drip, repeat L leg angiogram, and possible surgical intervention this admission. I discussed with the patient the nature of angiographic procedures, especially the limited patencies of any endovascular intervention. The patient is aware of that the risks of an angiographic procedure include but are not limited to: bleeding, infection, access site complications, embolization, rupture of treated vessel, dissection, possible need for emergent surgical intervention, and possible need for surgical procedures to treat the patient's pathology. The patient is aware of the risks and agrees to proceed.  The procedure is scheduled for repeat L leg angiogram tomorrow.  I discussed in depth with the patient the nature of atherosclerosis, and emphasized the importance of maximal medical management including strict control of blood pressure, blood glucose, and lipid levels, antiplatelet agents, obtaining regular exercise, and cessation of smoking.  The patient is aware that without maximal medical management the underlying atherosclerotic disease process will progress, limiting the benefit of any interventions.  Thank you for allowing [place] to participate in this patient's care.  Leonides Sake, MD [facility] [place] Office: [phone] Pager: [phone]  11/18/2011, 2:01 PM

## 2011-11-18 NOTE — ED Provider Notes (Signed)
12:53 PM Pt had iliac artery procedure with balloon dilation.  Now has had pain in left foot, some dusky color to the left second toe.  Call to Dr. Leonides Sake, vascular surgeon --> requests that arterial duplex study be done on pt's left leg.   Date: 11/18/2011  Rate: 98  Rhythm: normal sinus rhythm  QRS Axis: normal  Intervals: normal  ST/T Wave abnormalities: normal  Conduction Disutrbances:none  Narrative Interpretation: Normal EKG  Old EKG Reviewed: unchanged    Carleene Cooper III, MD 11/18/11 [phone]

## 2011-11-18 NOTE — Progress Notes (Signed)
Right Lower Extremity Vein Map    Right Great Saphenous Vein   Segment Diameter Comment  1. Origin 3.75mm   2. High Thigh 4.53mm   3. Mid Thigh 2.109mm branch  4. Low Thigh 2.31mm   5. At Knee 3.86mm   6. High Calf 2.23mm branch  7. Low Calf 2.25mm   8. Ankle 1.40mm    mm    mm    mm     Right Small Saphenous Vein  Segment Diameter Comment  1. Origin mm   2. High Calf mm   3. Low Calf mm   4. Ankle mm    mm    mm    mm   Left Lower Extremity Vein Map    Left Great Saphenous Vein   Segment Diameter Comment  1. Origin 4.65mm   2. High Thigh 4.5mm branch  3. Mid Thigh 3.55mm branch  4. Low Thigh 2.33mm   5. At Knee 2.60mm   6. High Calf 2.27mm   7. Low Calf 2.89mm   8. Ankle 1.37mm    mm    mm    mm     Left Small Saphenous Vein  Segment Diameter Comment  1. Origin mm   2. High Calf mm   3. Low Calf mm   4. Ankle mm    mm    mm    mm   Vessels are patent and tortuous throughout with multiple sites of branching noted.

## 2011-11-18 NOTE — Progress Notes (Signed)
ANTICOAGULATION CONSULT NOTE - Initial Consult  Pharmacy Consult:  Heparin Indication: possible left leg embolism after angioplasty   Allergies  Allergen Reactions  . Penicillins Hives and Itching    Patient Measurements:   Heparin Dosing Weight: 87kg  Vital Signs: BP: 146/52 mmHg (04/07 1440) Pulse Rate: 103  (04/07 1026)  Labs: No results found for this basename: HGB:2,HCT:3,PLT:3,APTT:3,LABPROT:3,INR:3,HEPARINUNFRC:3,CREATININE:3,CKTOTAL:3,CKMB:3,TROPONINI:3 in the last 72 hours The CrCl is unknown because both a height and weight (above a minimum accepted value) are required for this calculation.  Medical History: Past Medical History  Diagnosis Date  . Peripheral vascular disease   . Pain in limb   . Arthritis     gout  . Neuropathy   . Diabetes mellitus       Assessment: 37 YOF with h/o PVD who underwent left SFA on 11/15/11 and was treated for a near total occlusion post-operatively.  She presented today with possible embolic phenomena and to start anticoagulation with IV heparin.  Baseline labs pending - not on anticoagulants PTA.   Goal of Therapy:  HL 0.3 - 0.7 units/mL    Plan:  - Heparin 2500 units IV bolus (1/2 of actual bolus d/t recent procedure), then - Heparin gtt at 1200 units/hr - Check 6 hr HL, f/u baseline labs - Daily HL / CBC    Maxim Bedel D. Laney Potash, PharmD, BCPS Pager:  [phone] 11/18/2011, 2:55 PM

## 2011-11-18 NOTE — Progress Notes (Signed)
ANTICOAGULATION CONSULT NOTE - Initial Consult  Pharmacy Consult:  Heparin Indication: possible left leg embolism after angioplasty   Allergies  Allergen Reactions  . Penicillins Hives and Itching    Patient Measurements: Height: 5' 6.14" (168 cm) Weight: 257 lb 0.9 oz (116.6 kg) IBW/kg (Calculated) : 59.63  Heparin Dosing Weight: 87kg  Vital Signs: Temp: 98.1 F (36.7 C) (04/07 2020) Temp src: Oral (04/07 2020) BP: 159/89 mmHg (04/07 2020) Pulse Rate: 93  (04/07 2020)  Labs:  Basename 11/18/11 2235 11/18/11 1425  HGB -- 11.9*  HCT -- 36.2  PLT -- 235  APTT -- 31  LABPROT -- 13.2  INR -- 0.98  HEPARINUNFRC <0.10* --  CREATININE -- 0.63  CKTOTAL -- --  CKMB -- --  TROPONINI -- --   Estimated Creatinine Clearance: 98.5 ml/min (by C-G formula based on Cr of 0.63).  Assessment: 38 YOF s/p  left SFA on 11/15/11 now with possible embolic phenomena for heparin  Goal of Therapy:  HL 0.3 - 0.7 units/mL    Plan:  Heparin 2000 units IV bolus, then increase heparin 1600 untis/hr Follow-up am labs.   11/18/2011, 11:42 PM

## 2011-11-18 NOTE — ED Provider Notes (Signed)
History     CSN: [id number]  Arrival date & time 11/18/11  1014   First MD Initiated Contact with Patient 11/18/11 1157      Chief Complaint  Patient presents with  . Circulatory Problem    (Consider location/radiation/quality/duration/timing/severity/associated sxs/prior treatment) Patient is a 61 y.o. female presenting with lower extremity pain. The history is provided by the patient.  Foot Pain This is a recurrent problem. The current episode started in the past 7 days. The problem occurs constantly. The problem has been gradually worsening. Pertinent negatives include no chills or fever. Associated symptoms comments: She underwent a revascularization procedure 3 days ago by Dr. Imogene Burn to reperfuse the lower extremties. She states the right leg is doing well but the left foot, specifically the 1st, 2nd and 5th toes and increasingly painful and there is some discoloration. .    Past Medical History  Diagnosis Date  . Peripheral vascular disease   . Pain in limb   . Arthritis     gout  . Neuropathy   . Diabetes mellitus     Past Surgical History  Procedure Date  . Spine surgery   . Abdominal hysterectomy   . Vascular vein     Family History  Problem Relation Age of Onset  . Diabetes Other   . Cancer Other   . Heart disease Other   . Diabetes Mother   . Hypertension Mother   . Cancer Father     PROSTATE  . Diabetes Sister     History  Substance Use Topics  . Smoking status: Never Smoker   . Smokeless tobacco: Never Used  . Alcohol Use: No    OB History    Grav Para Term Preterm Abortions TAB SAB Ect Mult Living                  Review of Systems  Constitutional: Negative for fever and chills.  HENT: Negative.   Respiratory: Negative.   Cardiovascular: Negative.   Gastrointestinal: Negative.   Musculoskeletal:       See HPI.  Skin: Negative.   Neurological: Negative.     Allergies  Penicillins  Home Medications   Current Outpatient Rx  Name  Route Sig Dispense Refill  . ALBUTEROL SULFATE HFA 108 (90 BASE) MCG/ACT IN AERS Inhalation Inhale 2 puffs into the lungs every 4 (four) hours as needed. For shortness of breath    . ASPIRIN EC 81 MG PO TBEC Oral Take 81 mg by mouth daily.    . GLYBURIDE-METFORMIN 5-500 MG PO TABS Oral Take 1 tablet by mouth daily with breakfast.    . HYDROCODONE-ACETAMINOPHEN 10-650 MG PO TABS Oral Take 1 tablet by mouth every 6 (six) hours as needed. As needed for pain.    . INSULIN ASPART 100 UNIT/ML [place] SOLN Subcutaneous Inject 20 Units into the skin daily.     . INSULIN DETEMIR 100 UNIT/ML [place] SOLN Subcutaneous Inject 50 Units into the skin at bedtime.     . ADULT MULTIVITAMIN W/MINERALS CH Oral Take 1 tablet by mouth daily.    Marland Kitchen POTASSIUM CHLORIDE CRYS ER 20 MEQ PO TBCR Oral Take 40 mEq by mouth daily.     Marland Kitchen RANITIDINE HCL 150 MG PO CAPS Oral Take 150 mg by mouth daily as needed. As needed for acid reflux.      BP 144/64  Pulse 103  Resp 20  SpO2 99%  Physical Exam  Constitutional: She is oriented to person, place, and time.  She appears well-developed and well-nourished.  Neck: Normal range of motion.  Pulmonary/Chest: Effort normal.  Musculoskeletal:       Left foot without swelling. Warm to the touch, and of equal temperature with right foot. DP pulse palpable. 2nd toe is hyperpigmented distally and exceptionally tender. Great toe is normal in color and also tender. 5th toe is mildly swollen without discoloration. Tender.  Neurological: She is alert and oriented to person, place, and time.  Skin: Skin is warm and dry.    ED Course  Procedures (including critical care time)  Labs Reviewed - No data to display No results found.   No diagnosis found.    MDM          Rodena Medin, PA-C 11/18/11 1305

## 2011-11-18 NOTE — ED Notes (Signed)
Dr Imogene Burn in w/pt and advised he is planning to admit her to 2000 - Telemetry.

## 2011-11-19 ENCOUNTER — Encounter (HOSPITAL_COMMUNITY)
Admission: EM | Disposition: A | Discharge status: Home / Self Care | Payer: Self-pay | Source: Home / Self Care | Attending: Vascular Surgery

## 2011-11-19 DIAGNOSIS — I739 Peripheral vascular disease, unspecified: Secondary | ICD-10-CM

## 2011-11-19 HISTORY — PX: LOWER EXTREMITY ANGIOGRAM: SHX5508

## 2011-11-19 LAB — CBC
HCT: 33.7 % — ABNORMAL LOW (ref 36.0–46.0)
Hemoglobin: 11 g/dL — ABNORMAL LOW (ref 12.0–15.0)
MCV: 78.9 fL (ref 78.0–100.0)
RBC: 4.27 MIL/uL (ref 3.87–5.11)
WBC: 6.5 10*3/uL (ref 4.0–10.5)

## 2011-11-19 LAB — GLUCOSE, CAPILLARY: Glucose-Capillary: 161 mg/dL — ABNORMAL HIGH (ref 70–99)

## 2011-11-19 LAB — URINALYSIS, ROUTINE W REFLEX MICROSCOPIC
Bilirubin Urine: NEGATIVE
Glucose, UA: 250 mg/dL — AB
Hgb urine dipstick: NEGATIVE
Ketones, ur: NEGATIVE mg/dL
Protein, ur: NEGATIVE mg/dL

## 2011-11-19 LAB — HEPARIN LEVEL (UNFRACTIONATED): Heparin Unfractionated: <0.1 IU/mL — ABNORMAL LOW (ref 0.30–0.70)

## 2011-11-19 LAB — POCT ACTIVATED CLOTTING TIME: Activated Clotting Time: 89 seconds

## 2011-11-19 SURGERY — ANGIOGRAM, LOWER EXTREMITY
Anesthesia: Moderate Sedation | Laterality: Left

## 2011-11-19 MED ORDER — ASPIRIN EC 81 MG PO TBEC
81.0000 mg | DELAYED_RELEASE_TABLET | Freq: Every day | ORAL | Status: DC
  Administered 2011-11-19 – 2011-11-21 (×3): 81 mg via ORAL
  Filled 2011-11-19 (×4): qty 1

## 2011-11-19 MED ORDER — OXYCODONE-ACETAMINOPHEN 5-325 MG PO TABS
1.0000 | ORAL_TABLET | ORAL | Status: DC | PRN
  Administered 2011-11-21: 2 via ORAL
  Filled 2011-11-19: qty 2

## 2011-11-19 MED ORDER — HEPARIN (PORCINE) IN NACL 2-0.9 UNIT/ML-% IJ SOLN
INTRAMUSCULAR | Status: AC
  Filled 2011-11-19: qty 1000

## 2011-11-19 MED ORDER — ALBUTEROL SULFATE HFA 108 (90 BASE) MCG/ACT IN AERS
2.0000 | INHALATION_SPRAY | RESPIRATORY_TRACT | Status: DC | PRN
  Filled 2011-11-19: qty 6.7

## 2011-11-19 MED ORDER — ACETAMINOPHEN 325 MG PO TABS: 650.0000 mg | ORAL_TABLET | ORAL | Status: DC | PRN

## 2011-11-19 MED ORDER — ENOXAPARIN SODIUM 40 MG/0.4ML ~~LOC~~ SOLN
40.0000 mg | SUBCUTANEOUS | Status: DC
  Administered 2011-11-19 – 2011-11-20 (×2): 40 mg via SUBCUTANEOUS
  Filled 2011-11-19 (×3): qty 0.4

## 2011-11-19 MED ORDER — CLOPIDOGREL BISULFATE 75 MG PO TABS
75.0000 mg | ORAL_TABLET | Freq: Every day | ORAL | Status: DC
  Administered 2011-11-20 – 2011-11-21 (×2): 75 mg via ORAL
  Filled 2011-11-19: qty 1

## 2011-11-19 MED ORDER — ADULT MULTIVITAMIN W/MINERALS CH
1.0000 | ORAL_TABLET | Freq: Every day | ORAL | Status: DC
  Administered 2011-11-19 – 2011-11-21 (×3): 1 via ORAL
  Filled 2011-11-19 (×4): qty 1

## 2011-11-19 MED ORDER — FAMOTIDINE 20 MG PO TABS
20.0000 mg | ORAL_TABLET | Freq: Two times a day (BID) | ORAL | Status: DC
  Administered 2011-11-19 – 2011-11-21 (×4): 20 mg via ORAL
  Filled 2011-11-19 (×6): qty 1

## 2011-11-19 MED ORDER — MORPHINE SULFATE 4 MG/ML IJ SOLN: 2.0000 mg | INTRAMUSCULAR | Status: DC | PRN

## 2011-11-19 MED ORDER — POTASSIUM CHLORIDE CRYS ER 20 MEQ PO TBCR
40.0000 meq | EXTENDED_RELEASE_TABLET | Freq: Every day | ORAL | Status: DC
  Administered 2011-11-19 – 2011-11-21 (×3): 40 meq via ORAL
  Filled 2011-11-19 (×3): qty 2

## 2011-11-19 MED ORDER — FENTANYL CITRATE 0.05 MG/ML IJ SOLN
INTRAMUSCULAR | Status: AC
  Filled 2011-11-19: qty 2

## 2011-11-19 MED ORDER — INSULIN DETEMIR 100 UNIT/ML ~~LOC~~ SOLN
50.0000 [IU] | Freq: Every day | SUBCUTANEOUS | Status: DC
  Administered 2011-11-19 – 2011-11-20 (×2): 50 [IU] via SUBCUTANEOUS
  Filled 2011-11-19: qty 10

## 2011-11-19 MED ORDER — INSULIN ASPART 100 UNIT/ML ~~LOC~~ SOLN
20.0000 [IU] | Freq: Every day | SUBCUTANEOUS | Status: DC
  Administered 2011-11-20 – 2011-11-21 (×2): 20 [IU] via SUBCUTANEOUS

## 2011-11-19 MED ORDER — SODIUM CHLORIDE 0.9 % IV SOLN
INTRAVENOUS | Status: DC
  Administered 2011-11-21: 02:00:00 via INTRAVENOUS

## 2011-11-19 MED ORDER — MIDAZOLAM HCL 2 MG/2ML IJ SOLN
INTRAMUSCULAR | Status: AC
  Filled 2011-11-19: qty 2

## 2011-11-19 MED ORDER — ONDANSETRON HCL 4 MG/2ML IJ SOLN: 4.0000 mg | Freq: Four times a day (QID) | INTRAMUSCULAR | Status: DC | PRN

## 2011-11-19 MED ORDER — LIDOCAINE HCL (PF) 1 % IJ SOLN
INTRAMUSCULAR | Status: AC
  Filled 2011-11-19: qty 30

## 2011-11-19 NOTE — Progress Notes (Signed)
ANTICOAGULATION CONSULT NOTE - Follow Up Consult  Pharmacy Consult for heparin Indication: possible left leg embolism after angioplasty     Assessment: 19 YOF s/p left SFA on 11/15/11 now with possible embolic phenomena on heparin. HL undedectable this am drawn after drip was turned off for procedure. No events overnight noted.  Goal of Therapy:  Heparin level 0.3-0.7 units/ml   Plan:  Follow up restart of heparin post procedure   Allergies  Allergen Reactions  . Penicillins Hives and Itching    Patient Measurements: Height: 5' 6.14" (168 cm) Weight: 257 lb 0.9 oz (116.6 kg) IBW/kg (Calculated) : 59.63  Heparin Dosing Weight: 87kg  Vital Signs: Temp: 98.5 F (36.9 C) (04/08 0405) Temp src: Oral (04/08 0405) BP: 128/70 mmHg (04/08 0405) Pulse Rate: 76  (04/08 0405)  Labs:  Basename 11/19/11 0952 11/18/11 2235 11/18/11 1425  HGB -- -- 11.9*  HCT -- -- 36.2  PLT -- -- 235  APTT -- -- 31  LABPROT -- -- 13.2  INR -- -- 0.98  HEPARINUNFRC <0.10* <0.10* --  CREATININE -- -- 0.63  CKTOTAL -- -- --  CKMB -- -- --  TROPONINI -- -- --   Estimated Creatinine Clearance: 98.5 ml/min (by C-G formula based on Cr of 0.63).   Severiano Gilbert 11/19/2011,11:10 AM

## 2011-11-19 NOTE — Op Note (Addendum)
OPERATIVE NOTE   PROCEDURE: 1.  Right common femoral artery cannulation under ultrasound guidance 2.  Aortogram 3.  Third order arterial selection 4.  Left leg runoff  PRE-OPERATIVE DIAGNOSIS: Recent left superficial femoral artery angioplasty, possible embolism to left tibial arteries  POST-OPERATIVE DIAGNOSIS: same as above   SURGEON: Leonides Sake, MD  ANESTHESIA: conscious sedation  ESTIMATED BLOOD LOSS: 30 cc  CONTRAST: 95 cc  FINDING(S):  Left common femoral artery, superficial femoral artery, and profunda femoral artery widely open  Left popliteal artery patent  Left trifurcation open with three vessel runoff into foot: AT and PT dominant  No evidence of dissection or embolism  SPECIMEN(S):  none  INDICATIONS:   Gwendolyn Grimes is a 61 y.o. female who presents with left foot decreased motor and color changes consistent with possible embolism.  The patient was told to follow up in the office on Friday, the day after the procedure, when she called in with these complaints.  Instead she showed up in the emergency room on Sunday.  I admitted her with concern for possible embolism.  She had biphasic signals in her left foot at that time with some evidence of poor left lower leg movement.  I felt given there was some suggestion of a possible non-flow limiting dissection vs wire bias in the left superficial femoral artery on completion angiogram on Thursday, further workup with a repeat left leg angiogram was indicated.  I discussed with the patient the nature of angiographic procedures, especially the limited patencies of any endovascular intervention.  The patient is aware of that the risks of an angiographic procedure include but are not limited to: bleeding, infection, access site complications, renal failure, embolization, rupture of vessel, dissection, possible need for emergent surgical intervention, possible need for surgical procedures to treat the patient's pathology, and  stroke and death.  The patient is aware of the risks and agrees to proceed.  DESCRIPTION: After full informed consent was obtained from the patient, the patient was brought back to the angiography suite.  The patient was placed supine upon the angiography table and connected to monitoring equipment.  The patient was then given conscious sedation, the amounts of which are documented in the patient's chart.  The patient was prepped and drape in the standard fashion for an angiographic procedure.  At this point, attention was turned to the right groin.  Under ultrasound guidance, the right common femoral artery was cannulated with a micropuncture needle.  The microwire was advanced into the iliac arterial system.  The needle was exchanged for a microsheath, which was loaded into the common femoral artery over the wire.  The microwire was exchanged for a [facility] wire which was advanced into the aorta.  The microsheath was then exchanged for a 5-Fr sheath which was loaded into the common femoral artery.  The [facility] wire was replaced in the catheter, and using the [place] and Omniflush catheter, the left common iliac artery was selected.  The catheter would not track over the bifurcation so it was exchanged for a end hole catheter.  The catheter and wire were advanced into the external iliac artery.  An injection demonstrated however the catheter was in the left internal iliac artery.  I pulled the catheter back and using a Glidewire selected the external iliac artery, and advanced both into the common femoral artery.  The catheter was connected to the power injector circuit after a de-clotting and de-airring maneuver.  An automated left leg runoff was completed.  A  lateral foot injection was also completed.  Based on these images, there is no evidence of dissection or embolism of any type.  The catheter was removed.  The sheath was aspirated.  No clots were present and the sheath was reloaded with heparinized saline.   The sheath will be removed in the holding area.    COMPLICATIONS: none  CONDITION: stable   Leonides Sake, MD [facility] [place] Office: [phone] Pager: [phone]  11/19/2011, 2:59 PM

## 2011-11-19 NOTE — Progress Notes (Signed)
[facility] [place]  Progress Note Bypass Surgery  Date of Surgery: 11/19/2011  Procedure(s): LOWER EXTREMITY ANGIOGRAM Surgeon: Surgeon(s): [name], MD   History of Present Illness  Gwendolyn Grimes is a 61 y.o. female who is to have LOWER EXTREMITY ANGIOGRAM left.  The patient's pre-op symptoms of pain and discoloration in left toes are Improved . Patients pain is well controlled.  She startes the pain is much better in the foot and she can now move the left foot ankle normally  VASC. LAB Studies:        ABI: Right >1.0;  Left 0.87;   Imaging: Dg Chest 2 View  11/18/2011  *RADIOLOGY REPORT*  Clinical Data: Preoperative respiratory exam.  Peripheral vascular disease.  CHEST - 2 VIEW  Comparison: None.  Findings: Heart size and pulmonary vascularity are normal and the lungs are clear.  No osseous abnormality.  IMPRESSION: Normal chest.  Original Report Authenticated By: Gwynn Burly, M.D.    Significant Diagnostic Studies: CBC Lab Results  Component Value Date   WBC 8.6 11/18/2011   HGB 11.9* 11/18/2011   HCT 36.2 11/18/2011   MCV 78.5 11/18/2011   PLT 235 11/18/2011    BMET     Component Value Date/Time   NA 136 11/18/2011 1425   K 3.7 11/18/2011 1425   CL 98 11/18/2011 1425   CO2 28 11/18/2011 1425   GLUCOSE 271* 11/18/2011 1425   BUN 9 11/18/2011 1425   CREATININE 0.63 11/18/2011 1425   CALCIUM 9.2 11/18/2011 1425   GFRNONAA >90 11/18/2011 1425   GFRAA >90 11/18/2011 1425    COAG Lab Results  Component Value Date   INR 0.98 11/18/2011   No results found for this basename: PTT    Physical Examination  BP Readings from Last 3 Encounters:  11/19/11 128/70  11/19/11 128/70  11/15/11 164/83   Temp Readings from Last 3 Encounters:  11/19/11 98.5 F (36.9 C) Oral  11/19/11 98.5 F (36.9 C) Oral  11/15/11 97.4 F (36.3 C) Oral   SpO2 Readings from Last 3 Encounters:  11/19/11 98%  11/19/11 98%  11/15/11 98%   Pulse Readings from Last 3 Encounters:   11/19/11 76  11/19/11 76  11/15/11 94    Pt is A&O x 3 left lower extremity: with cyanosis of 2nd toe Limb is warm; with good color, motion and sensation  Right Dorsalis Pedis pulse is palpable  Left Dorsalis Pedis pulse is palpable  Assessment/Plan: Pt. Doing well  pain is controlled Angio today   [name] [phone] 11/19/2011 7:45 AM

## 2011-11-19 NOTE — Interval H&P Note (Signed)
[facility] [place]  History and Physical Update  The patient was interviewed and re-examined.  The patient's previous History and Physical has been reviewed and is unchanged.  There is no change in the plan of care.  Leonides Sake, MD [facility] [place] Office: [phone] Pager: [phone]  11/19/2011, 7:30 AM

## 2011-11-19 NOTE — Progress Notes (Signed)
Inpatient Diabetes Program Recommendations  AACE/ADA: New Consensus Statement on Inpatient Glycemic Control  Target Ranges:  Prepandial:   less than 140 mg/dL      Peak postprandial:   less than 180 mg/dL (1-2 hours)      Critically ill patients:  140 - 180 mg/dL  Pager:  [phone] Hours:  8 am-10pm   Reason for Visit: Elevated glucose:  254 mg/dL  Inpatient Diabetes Program Recommendations Insulin - Basal: Add home Levemir dose:  50 units qhs HgbA1C: Check HgbA1C to assess glycemic control

## 2011-11-20 ENCOUNTER — Inpatient Hospital Stay (HOSPITAL_COMMUNITY): Payer: PRIVATE HEALTH INSURANCE

## 2011-11-20 ENCOUNTER — Observation Stay (HOSPITAL_COMMUNITY): Payer: PRIVATE HEALTH INSURANCE

## 2011-11-20 LAB — CBC
HCT: 36.2 % (ref 36.0–46.0)
Hemoglobin: 11.8 g/dL — ABNORMAL LOW (ref 12.0–15.0)
MCHC: 32.6 g/dL (ref 30.0–36.0)
RBC: 4.58 MIL/uL (ref 3.87–5.11)
WBC: 6 10*3/uL (ref 4.0–10.5)

## 2011-11-20 LAB — BASIC METABOLIC PANEL
BUN: 7 mg/dL (ref 6–23)
CO2: 29 mEq/L (ref 19–32)
Chloride: 103 mEq/L (ref 96–112)
Glucose, Bld: 71 mg/dL (ref 70–99)
Potassium: 3.6 mEq/L (ref 3.5–5.1)
Sodium: 140 mEq/L (ref 135–145)

## 2011-11-20 LAB — GLUCOSE, CAPILLARY
Glucose-Capillary: 220 mg/dL — ABNORMAL HIGH (ref 70–99)
Glucose-Capillary: 163 mg/dL — ABNORMAL HIGH (ref 70–99)
Glucose-Capillary: 205 mg/dL — ABNORMAL HIGH (ref 70–99)
Glucose-Capillary: 365 mg/dL — ABNORMAL HIGH (ref 70–99)

## 2011-11-20 MED ORDER — LORAZEPAM 2 MG/ML IJ SOLN
1.0000 mg | Freq: Once | INTRAMUSCULAR | Status: AC
  Administered 2011-11-20: 1 mg via INTRAVENOUS
  Filled 2011-11-20: qty 1

## 2011-11-20 NOTE — Progress Notes (Signed)
Utilization review complete 

## 2011-11-20 NOTE — Progress Notes (Signed)
[facility]  Daily Progress Note  Assessment/Planning: POD #1 s/p L leg angiogram   Pt's previous sx do not correspond to her angiogram findings.  She previously had embolic sx and decreased motor in left lower leg, but there is no signs of embolism, dissection, or occlusion on the L leg angiogram yesterday.  Also paralysis in this setting would have required acute ischemia, which this patient does not have.  Subsequently, consideration of possible CVA should be considered.    Pt could not tolerate MRI Brain so while obtain head CT to evaluate for acute CVA  D/C if CT normal  Subjective  - 1 Day Post-Op  No complaints, foot feets better  Objective Filed Vitals:   11/19/11 1830 11/19/11 1900 11/19/11 2000 11/20/11 0624  BP: 133/52 137/53 145/63 144/96  Pulse: 73 74 78 79  Temp:   98.4 F (36.9 C) 98 F (36.7 C)  TempSrc:   Oral Oral  Resp:   18 18  Height:      Weight:      SpO2: 100% 100% 98% 98%    Intake/Output Summary (Last 24 hours) at 11/20/11 1223 Last data filed at 11/20/11 0700  Gross per 24 hour  Intake    360 ml  Output      0 ml  Net    360 ml    PULM  CTAB CV  RRR GI  soft, NTND VASC  L foot warm with pulses, 2nd toe less ischemic, L heel less ischemic in appearance, no hemtoma in R groin  Laboratory CBC    Component Value Date/Time   WBC 6.0 11/20/2011 0821   HGB 11.8* 11/20/2011 0821   HCT 36.2 11/20/2011 0821   PLT 236 11/20/2011 0821    BMET    Component Value Date/Time   NA 140 11/20/2011 0821   K 3.6 11/20/2011 0821   CL 103 11/20/2011 0821   CO2 29 11/20/2011 0821   GLUCOSE 71 11/20/2011 0821   BUN 7 11/20/2011 0821   CREATININE 0.69 11/20/2011 0821   CALCIUM 9.2 11/20/2011 0821   GFRNONAA >90 11/20/2011 0821   GFRAA >90 11/20/2011 1610    Leonides Sake, MD [facility] [place] Office: [phone] Pager: [phone]  11/20/2011, 12:23 PM

## 2011-11-21 ENCOUNTER — Inpatient Hospital Stay (HOSPITAL_COMMUNITY): Payer: PRIVATE HEALTH INSURANCE

## 2011-11-21 LAB — CBC
HCT: 33.7 % — ABNORMAL LOW (ref 36.0–46.0)
Hemoglobin: 11 g/dL — ABNORMAL LOW (ref 12.0–15.0)
WBC: 5 10*3/uL (ref 4.0–10.5)

## 2011-11-21 LAB — GLUCOSE, CAPILLARY
Glucose-Capillary: 173 mg/dL — ABNORMAL HIGH (ref 70–99)
Glucose-Capillary: 121 mg/dL — ABNORMAL HIGH (ref 70–99)
Glucose-Capillary: 260 mg/dL — ABNORMAL HIGH (ref 70–99)

## 2011-11-21 MED ORDER — HYDROCODONE-ACETAMINOPHEN 10-650 MG PO TABS: 1.0000 | ORAL_TABLET | Freq: Four times a day (QID) | ORAL | Status: DC | PRN

## 2011-11-21 MED ORDER — CLOPIDOGREL BISULFATE 75 MG PO TABS: 75.0000 mg | ORAL_TABLET | Freq: Every day | ORAL | Status: AC

## 2011-11-21 NOTE — Progress Notes (Signed)
[facility] Progress Note  11/21/2011 8:28 AM POD 2  Subjective:  Ready to go home  Afebrile x 24hrs  98%RA Filed Vitals:   11/21/11 0404  BP: 147/63  Pulse: 77  Temp: 97.7 F (36.5 C)  Resp: 18    Physical Exam: Incisions:  Right groin with bandage in tact.  No hematoma Extremities:  + palpable DP pulses bilaterally  CBC    Component Value Date/Time   WBC 5.0 11/21/2011 0530   RBC 4.35 11/21/2011 0530   HGB 11.0* 11/21/2011 0530   HCT 33.7* 11/21/2011 0530   PLT 204 11/21/2011 0530   MCV 77.5* 11/21/2011 0530   MCH 25.3* 11/21/2011 0530   MCHC 32.6 11/21/2011 0530   RDW 16.0* 11/21/2011 0530    BMET    Component Value Date/Time   NA 140 11/20/2011 0821   K 3.6 11/20/2011 0821   CL 103 11/20/2011 0821   CO2 29 11/20/2011 0821   GLUCOSE 71 11/20/2011 0821   BUN 7 11/20/2011 0821   CREATININE 0.69 11/20/2011 0821   CALCIUM 9.2 11/20/2011 0821   GFRNONAA >90 11/20/2011 0821   GFRAA >90 11/20/2011 0821    INR    Component Value Date/Time   INR 0.98 11/18/2011 1425     Intake/Output Summary (Last 24 hours) at 11/21/11 0828 Last data filed at 11/21/11 0600  Gross per 24 hour  Intake   1800 ml  Output    500 ml  Net   1300 ml   CT Scan 11/20/11: Findings: Ventricle size is normal. Mild patchy hypodensity in the  cerebral white matter bilaterally, suggestive of chronic  microvascular ischemia. No acute infarct. Negative for hemorrhage  or mass. Calvarium is intact.  IMPRESSION:  No acute abnormality. Probable mild chronic microvascular ischemia  in the white matter.   Assessment/Plan:  60 y.o. female is s/p  1. Right common femoral artery cannulation under ultrasound guidance  2. Aortogram  3. Third order arterial selection  4. Left leg runoff   POD 2 -CT scan was normal per Dr. Imogene Burn -d/c home today. -f/u with Dr. Imogene Burn in 4 weeks. -explained to pt she can remove bandage and shower daily.   [name], PA-C [facility] [phone] 11/21/2011 8:28 AM

## 2011-11-21 NOTE — Discharge Summary (Signed)
[facility] Discharge Summary  Gwendolyn Grimes Sep 21, 1950 61 y.o. 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 female who presents with chief complaint: color changes and pain in left foot. 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 The patient had resolution of her prior sx until the next day. At this time, she noted darkening in her toes and pain her toes. She instructed to come into the office for evaluation but due to transportation issues, she did not do so. She was also told to go the ER but did not do such. Pain is described as sharp, severity 3-6/10, and associated with ambulation and rest. Patient has attempted to treat this pain with OTC pain rx. The patient has no rest pain symptoms also and new left heel ulceration. Atherosclerotic risk factors include: diabetes.   Hospital Course:  The patient was admitted to the hospital and had ABIs performed and were as follows:  RIGHT    LEFT     PRESSURE  WAVEFORM   PRESSURE  WAVEFORM   BRACHIAL  149  T  BRACHIAL  146  T   DP    DP     AT  152  M  AT  127  DM   PT  162  M  PT  130  DM   PER    PER     GREAT TOE   NA  GREAT TOE   NA     RIGHT  LEFT   ABI  >1.0  0.87      On HD 1, the pt's symptoms had improved.    and taken to the [facility] on  11/19/2011 and underwent  1. Right common femoral artery cannulation under ultrasound guidance  2. Aortogram  3. Third order arterial selection  4. Left leg runoff  She tolerated well and was transported to the PACU in satisfactory condition.  On POD 1 from angiogram, Dr. Nicky Pugh assessment was as follows:   Pt's previous sx do not correspond to her angiogram findings.  She  previously had embolic sx and decreased motor in left lower leg, but there is no signs of embolism, dissection, or occlusion on the L leg angiogram yesterday. Also paralysis in this setting would have required acute ischemia, which this patient does not have.  Subsequently, consideration of possible CVA should be considered.  Pt could not tolerate MRI Brain so while obtain head CT to evaluate for acute CVA  D/C if CT normal  Her CT scan results are as follows:  Findings: Ventricle size is normal. Mild patchy hypodensity in the  cerebral white matter bilaterally, suggestive of chronic  microvascular ischemia. No acute infarct. Negative for hemorrhage  or mass. Calvarium is intact.  IMPRESSION:  No acute abnormality. Probable mild chronic microvascular ischemia  in the white matter.  Will d/c home on Plavix.  The remainder of the hospital course consisted of increasing ambulation and increasing intake of solids without difficulty.    Basename 11/20/11 0821 11/18/11 1425  NA 140 136  K 3.6 3.7  CL 103 98  CO2 29 28  GLUCOSE 71 271*  BUN 7 9  CALCIUM 9.2 9.2    Basename 11/21/11 0530  11/20/11 0821  WBC 5.0 6.0  HGB 11.0* 11.8*  HCT 33.7* 36.2  PLT 204 236    Basename 11/18/11 1425  INR 0.98     Discharge Instructions:   The patient is discharged to home with extensive instructions on wound care and progressive ambulation.  They are instructed not to drive or perform any heavy lifting until returning to see the physician in his office.  Discharge Orders    Future Appointments: Provider: Department: Dept Phone: Center:   12/14/2011 10:30 AM [name], MD [facility] [phone] [facility]     Future Orders Please Complete By Expires   Resume previous diet      Lifting restrictions      Comments:   No lifting for 6 weeks   Call MD for:  temperature >100.5      Call MD for:  redness, tenderness, or signs of infection (pain, swelling, bleeding, redness, odor or  green/yellow discharge around incision site)      Call MD for:  severe or increased pain, loss or decreased feeling  in affected limb(s)      Driving Restrictions      Comments:   No driving while taking pain medication   may wash over wound with mild soap and water      Scheduling Instructions:   Shower daily with soap and water starting 11/21/11       Discharge Diagnosis:  Peripheral vascular complications [997.2] 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 Critical lower limb ischemia   Past Medical History  Diagnosis Date  . Peripheral vascular disease   . Pain in limb   . Arthritis     gout  . Neuropathy   . Diabetes mellitus      Le, [place]  Home Medication Instructions [id number]   Printed on:11/21/11 5621  Medication Information                    potassium chloride SA (K-DUR,KLOR-CON) 20 MEQ tablet Take 40 mEq by mouth daily.            insulin detemir (LEVEMIR) 100 UNIT/ML injection Inject 50 Units into the skin at bedtime.            insulin aspart (NOVOLOG) 100 UNIT/ML injection Inject 20 Units into the skin daily.            ranitidine (ZANTAC) 150 MG capsule Take 150 mg by mouth daily as needed. As needed for acid reflux.           glyBURIDE-metformin (GLUCOVANCE) 5-500 MG per tablet Take 1 tablet by mouth daily with breakfast.           aspirin EC 81 MG tablet Take 81 mg by mouth daily.           Multiple Vitamin (MULITIVITAMIN WITH MINERALS) TABS Take 1 tablet by mouth daily.           albuterol (PROVENTIL HFA;VENTOLIN HFA) 108 (90 BASE) MCG/ACT inhaler Inhale 2 puffs into the lungs every 4 (four) hours as needed. For shortness of breath           clopidogrel (PLAVIX) 75 MG tablet Take 1 tablet (75 mg total) by mouth daily with breakfast.           HYDROcodone-acetaminophen (LORCET) 10-650 MG per tablet Take 1 tablet by mouth every 6  (six) hours as needed for pain. As  needed for pain. #20 NR            Disposition: home  Patient's condition: is Good  Follow up: 1. Dr.  Imogene Burn in 4 weeks.   [name], PA-C [facility] [phone] 11/21/2011  8:32 AM  Addendum  I have independently interviewed and examined the patient, and I agree with the physician assistant's findings.  This pt's post-procedure sx are not consistent with her findings.  There was no evidence of dissection or embolism on her follow up left leg angiogram.  The other possible explanation for her sx was CVA but there was no evidence on her head CT.  She could not tolerated MRI Brain so unfortunately that could not be completed.  She is being discharged with now palpable pulses in the left foot with greatly improved ABI.  Follow up in 4 weeks to set up routine surveillance.  Leonides Sake, MD [facility] Office: [phone] Pager: [phone]  11/21/2011, 8:57 AM

## 2011-11-28 ENCOUNTER — Encounter: Payer: Self-pay | Admitting: Vascular Surgery

## 2011-12-13 ENCOUNTER — Encounter: Payer: Self-pay | Admitting: Vascular Surgery

## 2011-12-14 ENCOUNTER — Ambulatory Visit: Payer: PRIVATE HEALTH INSURANCE | Admitting: Vascular Surgery

## 2011-12-20 ENCOUNTER — Encounter: Payer: Self-pay | Admitting: Vascular Surgery

## 2011-12-21 ENCOUNTER — Telehealth: Payer: Self-pay

## 2011-12-21 ENCOUNTER — Ambulatory Visit: Payer: PRIVATE HEALTH INSURANCE | Admitting: Vascular Surgery

## 2011-12-21 ENCOUNTER — Telehealth: Payer: Self-pay | Admitting: Vascular Surgery

## 2011-12-21 NOTE — Telephone Encounter (Signed)
Pt. called to explain cancelling appt. For office follow-up today.  States her youngest sister's funeral was today.  Reported update on her feet:  Stated has some numbness and tingling of toes of right foot. Stated 2nd toe of left foot has improvement in color since procedure; stated the 4th toe is discolored, and the 5th toe is black.  Denies any increased drainage.  Denies redness, swelling. F/u appt. scheduled for 5/24.  Pt stated she cannot come any sooner for f/u, explaining she needs time to get through her sister's death.  Advised pt. to call to be seen sooner, if her symptoms worsen.  Verb. Understanding of.

## 2011-12-21 NOTE — Telephone Encounter (Signed)
i spoke with patient at 10:50am regarding her appointment scheduled for today for a post op fu after her aortogram. Her sister passed away recently and she had to reschedule her original appt from 12/14/11 until today. The funeral is for today so Gwendolyn Grimes had to rsc today's appt until 01/04/12 at her request. She stated her r foot was "looking bad" I indicated to her that I would have the triage nurse give her a call regarding this. Jacklyn Shell

## 2012-01-03 ENCOUNTER — Encounter: Payer: Self-pay | Admitting: Vascular Surgery

## 2012-01-04 ENCOUNTER — Ambulatory Visit: Payer: PRIVATE HEALTH INSURANCE | Admitting: Vascular Surgery

## 2012-01-31 ENCOUNTER — Encounter: Payer: Self-pay | Admitting: Vascular Surgery

## 2012-02-01 ENCOUNTER — Ambulatory Visit: Payer: PRIVATE HEALTH INSURANCE | Admitting: Vascular Surgery

## 2012-07-01 ENCOUNTER — Telehealth: Payer: Self-pay

## 2012-07-01 ENCOUNTER — Ambulatory Visit (INDEPENDENT_AMBULATORY_CARE_PROVIDER_SITE_OTHER): Payer: PRIVATE HEALTH INSURANCE | Admitting: Vascular Surgery

## 2012-07-01 ENCOUNTER — Encounter (INDEPENDENT_AMBULATORY_CARE_PROVIDER_SITE_OTHER): Payer: PRIVATE HEALTH INSURANCE | Admitting: *Deleted

## 2012-07-01 ENCOUNTER — Encounter: Payer: Self-pay | Admitting: Vascular Surgery

## 2012-07-01 VITALS — BP 171/78 | HR 87 | Ht 66.0 in | Wt 248.0 lb

## 2012-07-01 DIAGNOSIS — M79609 Pain in unspecified limb: Secondary | ICD-10-CM

## 2012-07-01 DIAGNOSIS — I999 Unspecified disorder of circulatory system: Secondary | ICD-10-CM

## 2012-07-01 DIAGNOSIS — I998 Other disorder of circulatory system: Secondary | ICD-10-CM

## 2012-07-01 DIAGNOSIS — R238 Other skin changes: Secondary | ICD-10-CM

## 2012-07-01 DIAGNOSIS — L819 Disorder of pigmentation, unspecified: Secondary | ICD-10-CM

## 2012-07-01 MED ORDER — TRAMADOL HCL 50 MG PO TABS: 50.0000 mg | ORAL_TABLET | Freq: Four times a day (QID) | ORAL | Status: DC | PRN

## 2012-07-01 NOTE — Telephone Encounter (Signed)
Pt. Called office to report dark discoloration of left great toe and 2nd toe.  States the bottom of toes are cold and the bottom of left foot is dark red.  Also reports left calf is swollen and hot; noted this x 2 days.   C/o pain in toes of left foot and states has to position them downward to ease pain.  Discussed w/ Dr. Arbie Cookey.  Advised pt. To come to office today and to schedule left lower extrem. Arterial duplex and ABI's.  Pt. notified and agrees to come to office today.

## 2012-07-01 NOTE — Progress Notes (Signed)
[facility]  S/P Angioplasty Date of Surgery: 11/19/2011 Surgeon: [facility] PROCEDURE:  1. Right common femoral artery cannulation under ultrasound guidance  2. Aortogram  3. Bilateral leg runoff  4. Third order arterial selection  5. Left superficial femoral artery angioplasty x 2 (4 mm x 120 mm, 5 mm x 20 mm)   History of Present Illness  Gwendolyn Grimes is a 61 y.o. female who presents for increasing night and rest pain in left calf and foot since 06/22/12. She states both feet are very cold, but the left calf has some tingling laterally. She also has some numbness in her foot. She has had previous back surgery as well. She states she must place the limb in the dependant position to sleep at night. She is using "goody" pack OTC for pain. She states the RLE is without symptoms  Pt had Left SFA PTA on 11/19/11 with good resolution of claudication symptoms with the following findings:  FINDING(S):  Aorta: patent  Superior mesenteric artery: patent Celiac artery: not visualized  Right  Left   RA  Patent, nephrogram present  Patent, nephrogram present   CIA  Patent  Patent   EIA  Patent  Patent   IIA  Patent  Patent   CFA  Patent  Patent   SFA  Patent  Near occlusion 11 cm after femoral bifurcation: resolved after angioplasty. Possible small non-flow limiting dissection proximally   PFA  Patent  Patent   Pop  Patent  Patent   Trif  Patent  Patent   AT  Patent  Patent   Pero  Patent  Patent   PT  Patent: dominant runoff  Patent: dominant runoff   B feet: with 3 vessel runoff  SPECIMEN(S): none Physical Examination  Filed Vitals:   07/01/12 1538  BP: 171/78  Pulse: 87    Pt is A&O x 3 Gait is normal left lower extremity: Incision/s is cool to touch but equal to right.  Dorsalis Pedis pulse on right is palpable  LE Art Duplex: 07/01/2012 Triphasic flow in prox SFA Biphasic flow in Mid SFA and tibials to the ankle ABI'S right 1.17; Left 1.04     Medical Decision Making  Gwendolyn Grimes is a 61 y.o. year old female who presents s/p left SFA PTA with 9 day hx worsening night and rest pain, lateral numbness in calf and numbness of toes. Arterial duplex shows good flow with biphasic waveform to the ankle on the left. Dr. Arbie Cookey in to evaluate pt.  With good flow per duplex these symptoms may be a combination of nerve compression from her back verses recurrent SFA occlusive disease. The limb does not appear to be threatened at this time Pt to F/U Friday with Dr. Donell Grimes for tramadol #30 sent to pt pharmacy  Clinic MD: TFE

## 2012-07-03 ENCOUNTER — Encounter: Payer: Self-pay | Admitting: Vascular Surgery

## 2012-07-04 ENCOUNTER — Ambulatory Visit (INDEPENDENT_AMBULATORY_CARE_PROVIDER_SITE_OTHER): Payer: PRIVATE HEALTH INSURANCE | Admitting: Vascular Surgery

## 2012-07-04 ENCOUNTER — Encounter: Payer: Self-pay | Admitting: Vascular Surgery

## 2012-07-04 VITALS — BP 153/70 | HR 80 | Resp 16 | Ht 66.0 in | Wt 251.0 lb

## 2012-07-04 DIAGNOSIS — R200 Anesthesia of skin: Secondary | ICD-10-CM | POA: Insufficient documentation

## 2012-07-04 DIAGNOSIS — R238 Other skin changes: Secondary | ICD-10-CM

## 2012-07-04 DIAGNOSIS — R209 Unspecified disturbances of skin sensation: Secondary | ICD-10-CM | POA: Insufficient documentation

## 2012-07-04 DIAGNOSIS — M79609 Pain in unspecified limb: Secondary | ICD-10-CM

## 2012-07-04 DIAGNOSIS — I739 Peripheral vascular disease, unspecified: Secondary | ICD-10-CM

## 2012-07-04 NOTE — Progress Notes (Signed)
[facility] [place]  Established Critical Limb Ischemia Patient  History of Present Illness  Gwendolyn Grimes is a 61 y.o. (09/07/1950) female who presents with chief complaint: left leg rest pain sx.  The pt previous had a PTA L SFA (11/18/11).  The patient has rest pain and no wounds.  The patient notes resumption of previous left leg sx.  The patient's treatment regimen currently included: maximal medical management.  She denies any history of gangrene or ulcers  Past Medical History  Diagnosis Date  . Peripheral vascular disease   . Pain in limb   . Arthritis     gout  . Neuropathy   . Diabetes mellitus     Past Surgical History  Procedure Date  . Spine surgery   . Abdominal hysterectomy   . Vascular vein   . Back surgery 2009  . Pulmonary embolism surgery November 18, 2011    Left leg    History   Social History  . Marital Status: Single    Spouse Name: N/A    Number of Children: N/A  . Years of Education: N/A   Occupational History  . Not on Grimes.   Social History Main Topics  . Smoking status: Never Smoker   . Smokeless tobacco: Never Used  . Alcohol Use: No  . Drug Use: No  . Sexually Active: Not on Grimes   Other Topics Concern  . Not on Grimes   Social History Narrative  . No narrative on Grimes    Family History  Problem Relation Age of Onset  . Diabetes Other   . Cancer Other   . Heart disease Other   . Diabetes Mother   . Hypertension Mother   . Cancer Father     PROSTATE  . Diabetes Sister     Current Outpatient Prescriptions on Grimes Prior to Visit  Medication Sig Dispense Refill  . albuterol (PROVENTIL HFA;VENTOLIN HFA) 108 (90 BASE) MCG/ACT inhaler Inhale 2 puffs into the lungs every 4 (four) hours as needed. For shortness of breath      . ammonium lactate (LAC-HYDRIN) 12 % lotion Apply 1 application topically as needed.      . aspirin EC 81 MG tablet Take 81 mg by mouth daily.      . clopidogrel (PLAVIX) 75 MG tablet Take 1  tablet (75 mg total) by mouth daily with breakfast.  30 tablet  12  . Fluticasone-Salmeterol (ADVAIR DISKUS) 100-50 MCG/DOSE AEPB Inhale 1 puff into the lungs every 12 (twelve) hours.      . glyBURIDE-metformin (GLUCOVANCE) 5-500 MG per tablet Take 1 tablet by mouth 2 (two) times daily with a meal.       . HYDROcodone-acetaminophen (LORCET) 10-650 MG per tablet Take 1 tablet by mouth every 6 (six) hours as needed for pain. As needed for pain.  20 tablet  0  . insulin aspart (NOVOLOG) 100 UNIT/ML injection Inject 20 Units into the skin daily. As directed      . insulin detemir (LEVEMIR) 100 UNIT/ML injection Inject 60 Units into the skin. As directed      . Multiple Vitamin (MULITIVITAMIN WITH MINERALS) TABS Take 1 tablet by mouth daily.      . potassium chloride SA (K-DUR,KLOR-CON) 20 MEQ tablet Take 40 mEq by mouth daily.       . ranitidine (ZANTAC) 150 MG capsule Take 150 mg by mouth daily as needed. As needed for acid reflux.      . terbinafine (  LAMISIL) 250 MG tablet Take 250 mg by mouth daily.      . traMADol (ULTRAM) 50 MG tablet Take 1 tablet (50 mg total) by mouth every 6 (six) hours as needed for pain.  30 tablet  0    Allergies  Allergen Reactions  . Penicillins Hives and Itching   Review of Systems (Positive items checked otherwise negative)  General: [ ] Weight loss, [ ] Weight gain, [ ] Loss of appetite, [ ] Fever  Neurologic: [x Dizziness, [ ] Blackouts, [ ] Headaches, [ ] Seizure, [x] temporary loss of vision in one eye  Ear/Nose/Throat: [ ] Change in eyesight, [ ] Change in hearing, [ ] Nose bleeds, [ ] Sore throat  Vascular: [x] Pain in legs with walking, [x] Pain in feet while lying flat, [ ] Non-healing ulcer, [ ] Stroke, [ ] "Mini stroke", [ ] Slurred speech, [ ] Temporary blindness, [ ] Blood clot in vein, [ ] Phlebitis, [x] leg swelling  Pulmonary: [ ] Home oxygen, [ ] Productive cough, [ ] Bronchitis, [ ] Coughing up blood, [x]Asthma, [ ] Wheezing  Musculoskeletal: [ ]  Arthritis, [ ] Joint pain, [ ] Muscle pain  Cardiac: [ ] Chest pain, [ ] Chest tightness/pressure, [x] Shortness of breath when lying flat, [ ] Shortness of breath with exertion, [ ] Palpitations, [ ] Heart murmur, [ ] Arrythmia, [ ] Atrial fibrillation  Hematologic: [ ] Bleeding problems, [ ] Clotting disorder, [ ] Anemia  Psychiatric: [ ] Depression, [ ] Anxiety, [ ] Attention deficit disorder  Gastrointestinal: [ ] Black stool,[ ] Blood in stool, [ ] Peptic ulcer disease, [ ] Reflux, ] Hiatal hernia, [ ] Trouble swallowing, [ ] Diarrhea, [ ] Constipation  Urinary: [ ] Kidney disease, [ ] Burning with urination, [ ] Frequent urination, [ ] Difficulty urinating  Skin: [ ] Ulcers, [ ] Rashes   Physical Examination  Filed Vitals:   07/04/12 1121  BP: 153/70  Pulse: 80  Resp: 16  Height: 5' 6" (1.676 m)  Weight: 251 lb (113.853 kg)  SpO2: 100%   Body mass index is 40.51 kg/(m^2).   General: A&O x 3, WD, obese  Eyes: PERRLA, EOMI  Pulmonary: Sym exp, good air movt, CTAB, no rales, rhonchi, & wheezing  Cardiac: RRR, Nl S1, S2, no Murmurs, rubs or gallops  Vascular: Vessel Right Left  Radial Palpable Palpable  Ulnar Palpable Palpable  Brachial Palpable Palpable  Carotid Palpable, without bruit Palpable, without bruit  Aorta Not palpable N/A  Femoral Palpable Palpable  Popliteal Not palpable Not palpable  PT Palpable Not Palpable  DP Palpable Not Palpable   Gastrointestinal: soft, NTND, -G/R, - HSM, - masses, - CVAT B  Musculoskeletal: M/S 5/5 throughout , Extremities without ischemic changes   Neurologic: CN 2-12 intact , Pain and light touch intact in extremities , Motor exam as listed above  Non-Invasive Vascular Imaging LLE Arterial duplex (Date: 07/01/12)  CFA, PFA, SFA prox: triphasic  Rest of SFA and tibial: biphasic  Mid-SFA 362 c/s  Medical Decision Making  Gwendolyn Grimes is a 61 y.o. female who presents with: LLE rest pain.   Despite relatively  normal LLE arterial duplex except mid-SFA stenosis, I can't feel pulses in this patient's foot.  Given there was a 11 cm near occlusion in the L SFA, this patient is at risk for reocclusion of the L SFA, which her sx might reflect such.   Based on the patient's   vascular studies and examination, I have offered the patient: LLE angiogram, possible intervention. I discussed with the patient the nature of angiographic procedures, especially the limited patencies of any endovascular intervention.  The patient is aware of that the risks of an angiographic procedure include but are not limited to: bleeding, infection, access site complications, renal failure, embolization, rupture of vessel, dissection, possible need for emergent surgical intervention, possible need for surgical procedures to treat the patient's pathology, and stroke and death.   The patient is aware of the risks and agrees to proceed.  I discussed in depth with the patient the nature of atherosclerosis, and emphasized the importance of maximal medical management including strict control of blood pressure, blood glucose, and lipid levels, antiplatelet agents, obtaining regular exercise, and cessation of smoking.  The patient is aware that without maximal medical management the underlying atherosclerotic disease process will progress, limiting the benefit of any interventions.  I also discussed with the patient that her sx may be unrelated to her PAD.  Thank you for allowing us to participate in this patient's care.  Jeremi Losito, MD [facility] [place] Office: [phone] Pager: [phone]  07/04/2012, 12:28 PM    

## 2012-07-08 ENCOUNTER — Other Ambulatory Visit: Payer: Self-pay

## 2012-07-09 ENCOUNTER — Encounter (HOSPITAL_COMMUNITY): Payer: Self-pay | Admitting: Pharmacy Technician

## 2012-07-16 MED ORDER — SODIUM CHLORIDE 0.9 % IV SOLN
INTRAVENOUS | Status: DC
  Administered 2012-07-17: 07:00:00 via INTRAVENOUS

## 2012-07-17 ENCOUNTER — Ambulatory Visit (HOSPITAL_COMMUNITY)
Admission: RE | Admit: 2012-07-17 | Discharge: 2012-07-17 | Disposition: A | Discharge status: Home / Self Care | Payer: PRIVATE HEALTH INSURANCE | Source: Ambulatory Visit | Attending: Vascular Surgery | Admitting: Vascular Surgery

## 2012-07-17 ENCOUNTER — Encounter (HOSPITAL_COMMUNITY)
Admission: RE | Disposition: A | Discharge status: Home / Self Care | Payer: Self-pay | Source: Ambulatory Visit | Attending: Vascular Surgery

## 2012-07-17 ENCOUNTER — Telehealth: Payer: Self-pay | Admitting: Vascular Surgery

## 2012-07-17 DIAGNOSIS — I70229 Atherosclerosis of native arteries of extremities with rest pain, unspecified extremity: Secondary | ICD-10-CM | POA: Insufficient documentation

## 2012-07-17 DIAGNOSIS — Z79899 Other long term (current) drug therapy: Secondary | ICD-10-CM | POA: Insufficient documentation

## 2012-07-17 DIAGNOSIS — Z794 Long term (current) use of insulin: Secondary | ICD-10-CM | POA: Insufficient documentation

## 2012-07-17 DIAGNOSIS — I739 Peripheral vascular disease, unspecified: Secondary | ICD-10-CM

## 2012-07-17 DIAGNOSIS — M79609 Pain in unspecified limb: Secondary | ICD-10-CM

## 2012-07-17 DIAGNOSIS — E1149 Type 2 diabetes mellitus with other diabetic neurological complication: Secondary | ICD-10-CM | POA: Insufficient documentation

## 2012-07-17 DIAGNOSIS — E1142 Type 2 diabetes mellitus with diabetic polyneuropathy: Secondary | ICD-10-CM | POA: Insufficient documentation

## 2012-07-17 HISTORY — PX: LOWER EXTREMITY ANGIOGRAM: SHX5508

## 2012-07-17 LAB — POCT I-STAT, CHEM 8
BUN: 8 mg/dL (ref 6–23)
Calcium, Ion: 1.15 mmol/L (ref 1.12–1.23)
Creatinine, Ser: 0.8 mg/dL (ref 0.50–1.10)
TCO2: 26 mmol/L (ref 0–100)

## 2012-07-17 LAB — GLUCOSE, CAPILLARY: Glucose-Capillary: 209 mg/dL — ABNORMAL HIGH (ref 70–99)

## 2012-07-17 SURGERY — ANGIOGRAM, LOWER EXTREMITY
Anesthesia: LOCAL

## 2012-07-17 MED ORDER — FENTANYL CITRATE 0.05 MG/ML IJ SOLN
INTRAMUSCULAR | Status: AC
  Filled 2012-07-17: qty 2

## 2012-07-17 MED ORDER — SODIUM CHLORIDE 0.9 % IV SOLN
1.0000 mL/kg/h | INTRAVENOUS | Status: DC
  Administered 2012-07-17: 1 mL/kg/h via INTRAVENOUS

## 2012-07-17 MED ORDER — ONDANSETRON HCL 4 MG/2ML IJ SOLN: 4.0000 mg | Freq: Four times a day (QID) | INTRAMUSCULAR | Status: DC | PRN

## 2012-07-17 MED ORDER — OXYCODONE-ACETAMINOPHEN 5-325 MG PO TABS
1.0000 | ORAL_TABLET | ORAL | Status: DC | PRN
  Administered 2012-07-17: 2 via ORAL
  Filled 2012-07-17: qty 2

## 2012-07-17 MED ORDER — TRAMADOL HCL 50 MG PO TABS: 50.0000 mg | ORAL_TABLET | Freq: Four times a day (QID) | ORAL | Status: DC | PRN

## 2012-07-17 MED ORDER — MIDAZOLAM HCL 2 MG/2ML IJ SOLN
INTRAMUSCULAR | Status: AC
  Filled 2012-07-17: qty 2

## 2012-07-17 MED ORDER — MORPHINE SULFATE 2 MG/ML IJ SOLN
INTRAMUSCULAR | Status: AC
  Filled 2012-07-17: qty 1

## 2012-07-17 MED ORDER — ACETAMINOPHEN 325 MG PO TABS: 650.0000 mg | ORAL_TABLET | ORAL | Status: DC | PRN

## 2012-07-17 MED ORDER — CLOPIDOGREL BISULFATE 75 MG PO TABS: 75.0000 mg | ORAL_TABLET | Freq: Every day | ORAL | Status: DC

## 2012-07-17 MED ORDER — MORPHINE SULFATE 2 MG/ML IJ SOLN
2.0000 mg | INTRAMUSCULAR | Status: DC | PRN
  Administered 2012-07-17: 2 mg via INTRAVENOUS

## 2012-07-17 MED ORDER — LIDOCAINE HCL (PF) 1 % IJ SOLN
INTRAMUSCULAR | Status: AC
  Filled 2012-07-17: qty 30

## 2012-07-17 MED ORDER — HEPARIN (PORCINE) IN NACL 2-0.9 UNIT/ML-% IJ SOLN
INTRAMUSCULAR | Status: AC
  Filled 2012-07-17: qty 1000

## 2012-07-17 NOTE — Progress Notes (Signed)
UP AND WALKED AND TOL WELL; RIGHT GROIN STABLE; NO BLEEDING OR HEMATOMA 

## 2012-07-17 NOTE — Op Note (Signed)
OPERATIVE NOTE   PROCEDURE: 1.  Right common femoral artery cannulation under ultrasound guidance 2.  Second order arterial selection 3.  Left leg angiogram  PRE-OPERATIVE DIAGNOSIS: Left leg pain  POST-OPERATIVE DIAGNOSIS: same as above   SURGEON: Leonides Sake, MD  ANESTHESIA: conscious sedation  ESTIMATED BLOOD LOSS: 30 cc  CONTRAST: 35 cc  FINDING(S): Patent common, profunda, and superficial femoral arteries.   Previously angioplastied superficial femoral artery segment is patent with one calcified segment <50% stenosis Patent popliteal artery Patent posterior tibial artery and anterior tibial artery with runoff onto foot  SPECIMEN(S):  none  INDICATIONS:   Gwendolyn Grimes is a 61 y.o. female who presents with left leg pain.  The patient's arterial duplex demonstrates a stenosis in the superficial femoral artery.  Given the previous near occlusion of the superficial femoral artery, I am not surprised.  I recommended that we repeat the left leg angiogram to verify the arterial duplex findings and possibly re-intervene if needed.  I discussed with the patient the nature of angiographic procedures, especially the limited patencies of any endovascular intervention.  The patient is aware of that the risks of an angiographic procedure include but are not limited to: bleeding, infection, access site complications, renal failure, embolization, rupture of vessel, dissection, possible need for emergent surgical intervention, possible need for surgical procedures to treat the patient's pathology, and stroke and death.  The patient is aware of the risks and agrees to proceed.  DESCRIPTION: After full informed consent was obtained from the patient, the patient was brought back to the angiography suite.  The patient was placed supine upon the angiography table and connected to monitoring equipment.  The patient was then given conscious sedation, the amounts of which are documented in the patient's  chart.  The patient was prepped and drape in the standard fashion for an angiographic procedure.  At this point, attention was turned to the right groin.  Under ultrasound guidance, the right common femoral artery was cannulated with a micropuncture needle.  The microwire was advanced into the iliac arterial system.  The needle was exchanged for a microsheath, which was loaded into the common femoral artery over the wire.  The microwire was exchanged for a [facility] wire which was advanced into the aorta.  The microsheath was then exchanged for a 5-Fr sheath which was loaded into the common femoral artery.  The [facility] wire was replaced in the catheter, and using the [place] and Omniflush catheter, the left common iliac artery was selected.  The catheter and wire were advanced into the external iliac artery.  I did a hand injection to verify position.  This demonstrated left internal iliac artery cannulation, so I pulled back the catheter and wire into the left common iliac artery.  I exchanged the catheter for a Kumpfe catheter.  Using this combination, I selected the left common femoral artery.  The catheter was advanced into the left external iliac artery.  The wire was removed.  The catheter was connected to the power injector circuit after a de-airring and de-clotting maneuver.  An automated left leg runoff was completed.  Based on the angiogram, I cannot just the risk of re-angioplasty as there is at least a 3 mm lumen in the narrowest portion of the left superficial femoral artery.  There is in-line flow to the left foot, so this patient's left leg pain is unrelated to her PAD.  COMPLICATIONS: none  CONDITION: stable   Leonides Sake, MD [facility]  [place] Office: [phone] Pager: [phone]  07/17/2012, 9:41 AM

## 2012-07-17 NOTE — Telephone Encounter (Signed)
Patient was in recovery, mailed letter, dpm

## 2012-07-17 NOTE — H&P (View-Only) (Signed)
[facility] [place]  Established Critical Limb Ischemia Patient  History of Present Illness  Gwendolyn Grimes is a 61 y.o. (1950-08-27) female who presents with chief complaint: left leg rest pain sx.  The pt previous had a PTA L SFA (11/18/11).  The patient has rest pain and no wounds.  The patient notes resumption of previous left leg sx.  The patient's treatment regimen currently included: maximal medical management.  She denies any history of gangrene or ulcers  Past Medical History  Diagnosis Date  . Peripheral vascular disease   . Pain in limb   . Arthritis     gout  . Neuropathy   . Diabetes mellitus     Past Surgical History  Procedure Date  . Spine surgery   . Abdominal hysterectomy   . Vascular vein   . Back surgery 2009  . Pulmonary embolism surgery November 18, 2011    Left leg    History   Social History  . Marital Status: Single    Spouse Name: N/A    Number of Children: N/A  . Years of Education: N/A   Occupational History  . Not on file.   Social History Main Topics  . Smoking status: Never Smoker   . Smokeless tobacco: Never Used  . Alcohol Use: No  . Drug Use: No  . Sexually Active: Not on file   Other Topics Concern  . Not on file   Social History Narrative  . No narrative on file    Family History  Problem Relation Age of Onset  . Diabetes Other   . Cancer Other   . Heart disease Other   . Diabetes Mother   . Hypertension Mother   . Cancer Father     PROSTATE  . Diabetes Sister     Current Outpatient Prescriptions on File Prior to Visit  Medication Sig Dispense Refill  . albuterol (PROVENTIL HFA;VENTOLIN HFA) 108 (90 BASE) MCG/ACT inhaler Inhale 2 puffs into the lungs every 4 (four) hours as needed. For shortness of breath      . ammonium lactate (LAC-HYDRIN) 12 % lotion Apply 1 application topically as needed.      Marland Kitchen aspirin EC 81 MG tablet Take 81 mg by mouth daily.      . clopidogrel (PLAVIX) 75 MG tablet Take 1  tablet (75 mg total) by mouth daily with breakfast.  30 tablet  12  . Fluticasone-Salmeterol (ADVAIR DISKUS) 100-50 MCG/DOSE AEPB Inhale 1 puff into the lungs every 12 (twelve) hours.      Marland Kitchen glyBURIDE-metformin (GLUCOVANCE) 5-500 MG per tablet Take 1 tablet by mouth 2 (two) times daily with a meal.       . HYDROcodone-acetaminophen (LORCET) 10-650 MG per tablet Take 1 tablet by mouth every 6 (six) hours as needed for pain. As needed for pain.  20 tablet  0  . insulin aspart (NOVOLOG) 100 UNIT/ML injection Inject 20 Units into the skin daily. As directed      . insulin detemir (LEVEMIR) 100 UNIT/ML injection Inject 60 Units into the skin. As directed      . Multiple Vitamin (MULITIVITAMIN WITH MINERALS) TABS Take 1 tablet by mouth daily.      . potassium chloride SA (K-DUR,KLOR-CON) 20 MEQ tablet Take 40 mEq by mouth daily.       . ranitidine (ZANTAC) 150 MG capsule Take 150 mg by mouth daily as needed. As needed for acid reflux.      . terbinafine (  LAMISIL) 250 MG tablet Take 250 mg by mouth daily.      . traMADol (ULTRAM) 50 MG tablet Take 1 tablet (50 mg total) by mouth every 6 (six) hours as needed for pain.  30 tablet  0    Allergies  Allergen Reactions  . Penicillins Hives and Itching   Review of Systems (Positive items checked otherwise negative)  General: [ ]  Weight loss, [ ]  Weight gain, [ ]  Loss of appetite, [ ]  Fever  Neurologic: [x Dizziness, [ ]  Blackouts, [ ]  Headaches, [ ]  Seizure, [x]  temporary loss of vision in one eye  Ear/Nose/Throat: [ ]  Change in eyesight, [ ]  Change in hearing, [ ]  Nose bleeds, [ ]  Sore throat  Vascular: [x]  Pain in legs with walking, [x]  Pain in feet while lying flat, [ ]  Non-healing ulcer, [ ]  Stroke, [ ]  "Mini stroke", [ ]  Slurred speech, [ ]  Temporary blindness, [ ]  Blood clot in vein, [ ]  Phlebitis, [x]  leg swelling  Pulmonary: [ ]  Home oxygen, [ ]  Productive cough, [ ]  Bronchitis, [ ]  Coughing up blood, [x] Asthma, [ ]  Wheezing  Musculoskeletal: [ ]   Arthritis, [ ]  Joint pain, [ ]  Muscle pain  Cardiac: [ ]  Chest pain, [ ]  Chest tightness/pressure, [x]  Shortness of breath when lying flat, [ ]  Shortness of breath with exertion, [ ]  Palpitations, [ ]  Heart murmur, [ ]  Arrythmia, [ ]  Atrial fibrillation  Hematologic: [ ]  Bleeding problems, [ ]  Clotting disorder, [ ]  Anemia  Psychiatric: [ ]  Depression, [ ]  Anxiety, [ ]  Attention deficit disorder  Gastrointestinal: [ ]  Black stool,[ ]  Blood in stool, [ ]  Peptic ulcer disease, [ ]  Reflux, ] Hiatal hernia, [ ]  Trouble swallowing, [ ]  Diarrhea, [ ]  Constipation  Urinary: [ ]  Kidney disease, [ ]  Burning with urination, [ ]  Frequent urination, [ ]  Difficulty urinating  Skin: [ ]  Ulcers, [ ]  Rashes   Physical Examination  Filed Vitals:   07/04/12 1121  BP: 153/70  Pulse: 80  Resp: 16  Height: 5\' 6"  (1.676 m)  Weight: 251 lb (113.853 kg)  SpO2: 100%   Body mass index is 40.51 kg/(m^2).   General: A&O x 3, WD, obese  Eyes: PERRLA, EOMI  Pulmonary: Sym exp, good air movt, CTAB, no rales, rhonchi, & wheezing  Cardiac: RRR, Nl S1, S2, no Murmurs, rubs or gallops  Vascular: Vessel Right Left  Radial Palpable Palpable  Ulnar Palpable Palpable  Brachial Palpable Palpable  Carotid Palpable, without bruit Palpable, without bruit  Aorta Not palpable N/A  Femoral Palpable Palpable  Popliteal Not palpable Not palpable  PT Palpable Not Palpable  DP Palpable Not Palpable   Gastrointestinal: soft, NTND, -G/R, - HSM, - masses, - CVAT B  Musculoskeletal: M/S 5/5 throughout , Extremities without ischemic changes   Neurologic: CN 2-12 intact , Pain and light touch intact in extremities , Motor exam as listed above  Non-Invasive Vascular Imaging LLE Arterial duplex (Date: 07/01/12)  CFA, PFA, SFA prox: triphasic  Rest of SFA and tibial: biphasic  Mid-SFA 362 c/s  Medical Decision Making  Gwendolyn Grimes is a 61 y.o. female who presents with: LLE rest pain.   Despite relatively  normal LLE arterial duplex except mid-SFA stenosis, I can't feel pulses in this patient's foot.  Given there was a 11 cm near occlusion in the L SFA, this patient is at risk for reocclusion of the L SFA, which her sx might reflect such.   Based on the patient's  vascular studies and examination, I have offered the patient: LLE angiogram, possible intervention. I discussed with the patient the nature of angiographic procedures, especially the limited patencies of any endovascular intervention.  The patient is aware of that the risks of an angiographic procedure include but are not limited to: bleeding, infection, access site complications, renal failure, embolization, rupture of vessel, dissection, possible need for emergent surgical intervention, possible need for surgical procedures to treat the patient's pathology, and stroke and death.   The patient is aware of the risks and agrees to proceed.  I discussed in depth with the patient the nature of atherosclerosis, and emphasized the importance of maximal medical management including strict control of blood pressure, blood glucose, and lipid levels, antiplatelet agents, obtaining regular exercise, and cessation of smoking.  The patient is aware that without maximal medical management the underlying atherosclerotic disease process will progress, limiting the benefit of any interventions.  I also discussed with the patient that her sx may be unrelated to her PAD.  Thank you for allowing [place] to participate in this patient's care.  Leonides Sake, MD [facility] [place] Office: [phone] Pager: [phone]  07/04/2012, 12:28 PM

## 2012-07-17 NOTE — Progress Notes (Signed)
DR CHEN'S PA IN TO SEE CLIENT AND CHECK HER GROIN AND NO NEW ORDERS NOTED AND PER PA OK TO D/C HOME

## 2012-07-17 NOTE — Telephone Encounter (Signed)
Message copied by Fredrich Birks on Thu Jul 17, 2012 11:52 AM ------      Message from: Melene Plan      Created: Thu Jul 17, 2012 10:11 AM                   ----- Message -----         From: [name], MD         Sent: 07/17/2012   9:50 AM           To: Reuel Derby, Melene Plan, RN            Camyla Camposano      [id number]      Oct 22, 1950            PROCEDURE:      1.  Right common femoral artery cannulation under ultrasound guidance      2.  Second order arterial selection      3.  Left leg angiogram            Follow-up: 4 weeks

## 2012-07-17 NOTE — Interval H&P Note (Signed)
[facility] [place]  History and Physical Update  The patient was interviewed and re-examined.  The patient's previous History and Physical has been reviewed and is unchanged.  There is no change in the plan of care: LLE angiogram, possible intervention.  Leonides Sake, MD [facility] [place] Office: [phone] Pager: [phone]  07/17/2012, 7:46 AM

## 2012-08-21 ENCOUNTER — Encounter: Payer: Self-pay | Admitting: Vascular Surgery

## 2012-08-22 ENCOUNTER — Encounter: Payer: Self-pay | Admitting: Vascular Surgery

## 2012-08-22 ENCOUNTER — Ambulatory Visit (INDEPENDENT_AMBULATORY_CARE_PROVIDER_SITE_OTHER): Payer: PRIVATE HEALTH INSURANCE | Admitting: Vascular Surgery

## 2012-08-22 VITALS — BP 154/74 | HR 103 | Ht 66.0 in | Wt 250.0 lb

## 2012-08-22 DIAGNOSIS — I70219 Atherosclerosis of native arteries of extremities with intermittent claudication, unspecified extremity: Secondary | ICD-10-CM | POA: Insufficient documentation

## 2012-08-22 DIAGNOSIS — Z48812 Encounter for surgical aftercare following surgery on the circulatory system: Secondary | ICD-10-CM

## 2012-08-22 NOTE — Addendum Note (Signed)
Addended by: Sharee Pimple on: 08/22/2012 04:03 PM   Modules accepted: Orders

## 2012-08-22 NOTE — Progress Notes (Signed)
[facility] [place]  Postoperative Visit  History of Present Illness  Gwendolyn Grimes is a 62 y.o. female who presents for postoperative follow-up from procedure on Date: 07/17/12: Dx LRo .  That study demonstrated <50% stenosis in the L SFA.  Her sx are neuropathic in character.  She does not have intermittent claudication or rest pain sx.  She get a burning sensation on her left lateral leg with laying on it.  Past Medical History, Past Surgical History, Social History, Family History, Medications, Allergies, and Review of Systems are unchanged from previous evaluation on 07/17/12.  Physical Examination  Filed Vitals:   08/22/12 1434  BP: 154/74  Pulse: 103  Height: 5\' 6"  (1.676 m)  Weight: 250 lb (113.399 kg)  SpO2: 100%   Body mass index is 40.35 kg/(m^2).  General: A&O x 3, WD, morbidly obese  Pulmonary: Sym exp, good air movt, CTAB, no rales, rhonchi, & wheezing  Cardiac: RRR, Nl S1, S2, no Murmurs, rubs or gallops  Vascular: Vessel Right Left  Radial Palpable Palpable  Ulnar Palpable Palpable  Brachial Palpable Palpable  Carotid Palpable, without bruit Palpable, without bruit  Aorta Not palpable N/A  Femoral Palpable Palpable  Popliteal Not palpable Not palpable  PT Not Palpable Not Palpable  DP Palpable Palpable    Gastrointestinal: soft, NTND, -G/R, - HSM, - masses, - CVAT B  Musculoskeletal: M/S 5/5 throughout , Extremities without ischemic changes , right groin without hematoma, no echymosis present at cannulation site  Neurologic:  Pain and light touch intact in extremities except decrease sensation in both feet, Motor exam as listed above  Medical Decision Making  Gwendolyn Grimes is a 62 y.o. female who presents s/p L SFA PTA. Based on his angiographic findings, this patient needs: continued q3 month surveillance with ABI and LLE arterial duplex. This patient's sx are more c/w with neuropathy or radicular pain from a spinal  compressive etiology.  Appropriate evaluation for spine surgery or Neurology recommended. I discussed in depth with the patient the nature of atherosclerosis, and emphasized the importance of maximal medical management including strict control of blood pressure, blood glucose, and lipid levels, obtaining regular exercise, and cessation of smoking.  The patient is aware that without maximal medical management the underlying atherosclerotic disease process will progress, limiting the benefit of any interventions.  Thank you for allowing [place] to participate in this patient's care.  Leonides Sake, MD [facility] [place] Office: [phone] Pager: [phone]

## 2012-09-04 ENCOUNTER — Encounter: Payer: Self-pay | Admitting: Vascular Surgery

## 2012-09-05 ENCOUNTER — Other Ambulatory Visit: Payer: Self-pay | Admitting: *Deleted

## 2012-09-05 ENCOUNTER — Encounter: Payer: Self-pay | Admitting: Vascular Surgery

## 2012-09-05 ENCOUNTER — Encounter (INDEPENDENT_AMBULATORY_CARE_PROVIDER_SITE_OTHER): Payer: PRIVATE HEALTH INSURANCE | Admitting: *Deleted

## 2012-09-05 ENCOUNTER — Ambulatory Visit (INDEPENDENT_AMBULATORY_CARE_PROVIDER_SITE_OTHER): Payer: PRIVATE HEALTH INSURANCE | Admitting: Vascular Surgery

## 2012-09-05 VITALS — BP 149/77 | HR 98 | Ht 66.0 in | Wt 249.7 lb

## 2012-09-05 DIAGNOSIS — G629 Polyneuropathy, unspecified: Secondary | ICD-10-CM | POA: Insufficient documentation

## 2012-09-05 DIAGNOSIS — I739 Peripheral vascular disease, unspecified: Secondary | ICD-10-CM

## 2012-09-05 DIAGNOSIS — Z48812 Encounter for surgical aftercare following surgery on the circulatory system: Secondary | ICD-10-CM

## 2012-09-05 DIAGNOSIS — I70219 Atherosclerosis of native arteries of extremities with intermittent claudication, unspecified extremity: Secondary | ICD-10-CM

## 2012-09-05 DIAGNOSIS — G589 Mononeuropathy, unspecified: Secondary | ICD-10-CM

## 2012-09-05 NOTE — Progress Notes (Signed)
[facility] [place]   Established Critical Limb Ischemia  History of Present Illness  Gwendolyn Grimes is a 62 y.o. female who presents for routine surveillance.  Prior procedures included: PTA L SFA (11/18/11) and 07/17/12: Dx LRo . That study demonstrated <50% stenosis in the L SFA. Her sx are neuropathic in character. She does not have intermittent claudication or rest pain sx. She get a burning sensation on her left lateral leg with laying on it. She now is getting burning intermittently in R foot also.  Past Medical History, Past Surgical History, Social History, Family History, Medications, Allergies, and Review of Systems are unchanged from previous evaluation on 08/22/12.  Objective: Filed Vitals:   09/05/12 1244  BP: 149/77  Pulse: 98  Height: 5\' 6"  (1.676 m)  Weight: 249 lb 11.2 oz (113.263 kg)  SpO2: 100%  Body mass index is 40.30 kg/(m^2).  General: A&O x 3, WD, morbidly obese   Pulmonary: Sym exp, good air movt, CTAB, no rales, rhonchi, & wheezing   Cardiac: RRR, Nl S1, S2, no Murmurs, rubs or gallops   Vascular:  Vessel  Right  Left   Radial  Palpable  Palpable   Ulnar  Palpable  Palpable   Brachial  Palpable  Palpable   Carotid  Palpable, without bruit  Palpable, without bruit   Aorta  Not palpable  N/A   Femoral  Palpable  Palpable   Popliteal  Not palpable  Not palpable   PT  Not Palpable  Not Palpable   DP  Palpable  Palpable    Gastrointestinal: soft, NTND, -G/R, - HSM, - masses, - CVAT B   Musculoskeletal: M/S 5/5 throughout , Extremities without ischemic changes   Neurologic: Pain and light touch intact in extremities except decrease sensation in both feet, Motor exam as listed above   ABI (Date: 09/05/12)  R: 1.01, PT and DP: triphsic  L: 1.00, PT and DP: triphsic  LLE arterial duplex (Date: 09/05/12)  CFA: 224 c/s  PFA: 248 c/s  SFA: 79-404 c/s (ratio 2.14)  Medical Decision Making   Gwendolyn Grimes is a 62 y.o. female  who presents s/p L SFA PTA.  This patient's sx are more c/w with neuropathy or radicular pain from a spinal compressive etiology. Appropriate evaluation for spine surgery or Neurology recommended.  I discussed in depth with the patient the nature of atherosclerosis, and emphasized the importance of maximal medical management including strict control of blood pressure, blood glucose, and lipid levels, obtaining regular exercise, and cessation of smoking. The patient is aware that without maximal medical management the underlying atherosclerotic disease process will progress, limiting the benefit of any interventions. The patient will follow up in 3 months for surveillance studies: L arterial duplex and BLE ABI. Thank you for allowing [place] to participate in this patient's care.  Leonides Sake, MD  [facility] [place]  Office: [phone]  Pager: [phone]

## 2012-12-05 ENCOUNTER — Ambulatory Visit: Payer: PRIVATE HEALTH INSURANCE | Admitting: Vascular Surgery

## 2012-12-22 HISTORY — PX: TOOTH EXTRACTION: SUR596

## 2012-12-25 ENCOUNTER — Encounter: Payer: Self-pay | Admitting: Vascular Surgery

## 2012-12-26 ENCOUNTER — Encounter (INDEPENDENT_AMBULATORY_CARE_PROVIDER_SITE_OTHER): Payer: PRIVATE HEALTH INSURANCE | Admitting: *Deleted

## 2012-12-26 ENCOUNTER — Ambulatory Visit (INDEPENDENT_AMBULATORY_CARE_PROVIDER_SITE_OTHER): Payer: PRIVATE HEALTH INSURANCE | Admitting: Vascular Surgery

## 2012-12-26 ENCOUNTER — Encounter: Payer: Self-pay | Admitting: Vascular Surgery

## 2012-12-26 VITALS — BP 158/73 | HR 93 | Resp 16 | Ht 66.0 in | Wt 248.0 lb

## 2012-12-26 DIAGNOSIS — Z48812 Encounter for surgical aftercare following surgery on the circulatory system: Secondary | ICD-10-CM

## 2012-12-26 DIAGNOSIS — G629 Polyneuropathy, unspecified: Secondary | ICD-10-CM

## 2012-12-26 DIAGNOSIS — I739 Peripheral vascular disease, unspecified: Secondary | ICD-10-CM

## 2012-12-26 DIAGNOSIS — M79609 Pain in unspecified limb: Secondary | ICD-10-CM

## 2012-12-26 DIAGNOSIS — I998 Other disorder of circulatory system: Secondary | ICD-10-CM

## 2012-12-26 DIAGNOSIS — G589 Mononeuropathy, unspecified: Secondary | ICD-10-CM

## 2012-12-26 DIAGNOSIS — I999 Unspecified disorder of circulatory system: Secondary | ICD-10-CM

## 2012-12-26 NOTE — Progress Notes (Signed)
[facility] [place]  Established Critical Limb Ischemia Patient  History of Present Illness  Gwendolyn Grimes is a 62 y.o. (06/03/1951) female who presents with chief complaint: left leg pain.  Prior procedures included: PTA L SFA (11/18/11) and 07/17/12: Dx LRo.  The patient has intermittent neuropathic pain in both legs (L>R).  She also has some sx suggestive of possible rest pain.  The patient notes symptoms have progressed.  The patient's treatment regimen currently included: maximal medical management.  The patient denies any wounds  Past Medical History  Diagnosis Date  . Peripheral vascular disease   . Pain in limb   . Arthritis     gout  . Neuropathy   . Diabetes mellitus     Past Surgical History  Procedure Laterality Date  . Spine surgery    . Abdominal hysterectomy    . Vascular vein    . Back surgery  2009  . Pulmonary embolism surgery  November 18, 2011    Left leg  . Tooth extraction  Dec 22, 2012    X's 2    History   Social History  . Marital Status: Single    Spouse Name: N/A    Number of Children: N/A  . Years of Education: N/A   Occupational History  . Not on file.   Social History Main Topics  . Smoking status: Never Smoker   . Smokeless tobacco: Never Used  . Alcohol Use: No  . Drug Use: No  . Sexually Active: Not on file   Other Topics Concern  . Not on file   Social History Narrative  . No narrative on file    Family History  Problem Relation Age of Onset  . Diabetes Other   . Cancer Other   . Heart disease Other   . Diabetes Mother   . Hypertension Mother   . Cancer Father     PROSTATE  . Diabetes Sister     Current Outpatient Prescriptions on File Prior to Visit  Medication Sig Dispense Refill  . albuterol (PROVENTIL HFA;VENTOLIN HFA) 108 (90 BASE) MCG/ACT inhaler Inhale 2 puffs into the lungs every 4 (four) hours as needed. For shortness of breath      . aspirin EC 81 MG tablet Take 81 mg by mouth daily.       . clopidogrel (PLAVIX) 75 MG tablet Take 1 tablet (75 mg total) by mouth daily.  30 tablet  11  . fluconazole (DIFLUCAN) 150 MG tablet       . Fluticasone-Salmeterol (ADVAIR DISKUS) 100-50 MCG/DOSE AEPB Inhale 1 puff into the lungs every 12 (twelve) hours.      . glyBURIDE-metformin (GLUCOVANCE) 5-500 MG per tablet Take 1 tablet by mouth 2 (two) times daily with a meal.       . insulin aspart (NOVOLOG) 100 UNIT/ML injection Inject 20 Units into the skin every morning. As directed      . insulin detemir (LEVEMIR) 100 UNIT/ML injection Inject 60 Units into the skin at bedtime. As directed      . Multiple Vitamin (MULITIVITAMIN WITH MINERALS) TABS Take 1 tablet by mouth daily.      . NOVOFINE 30G X 8 MM MISC       . potassium chloride SA (K-DUR,KLOR-CON) 20 MEQ tablet Take 40 mEq by mouth daily.       . ranitidine (ZANTAC) 150 MG capsule Take 150 mg by mouth daily. As needed for acid reflux.      .   terbinafine (LAMISIL) 250 MG tablet Take 250 mg by mouth daily.      . traMADol (ULTRAM) 50 MG tablet Take 1 tablet (50 mg total) by mouth every 6 (six) hours as needed for pain.  30 tablet  0  . traMADol (ULTRAM) 50 MG tablet Take 1 tablet (50 mg total) by mouth every 6 (six) hours as needed for pain.  30 tablet  0   No current facility-administered medications on file prior to visit.    Allergies  Allergen Reactions  . Penicillins Hives and Itching    Review of Systems (Positive items checked otherwise negative)  General: [ ] Weight loss, [ ] Weight gain, [ ]  Loss of appetite, [ ] Fever  Neurologic: [ ] Dizziness, [ ] Blackouts, [ ] Headaches, [ ] Seizure, [x] numbness and paraesthesia in legs  Ear/Nose/Throat: [ ] Change in eyesight, [ ] Change in hearing, [ ] Nose bleeds, [ ] Sore throat  Vascular: [x] Pain in legs with walking, [x] Pain in feet while lying flat, [ ] Non-healing ulcer, [ ] Stroke, [ ] "Mini stroke", [ ] Slurred speech, [ ] Temporary blindness, [ ] Blood clot in vein, [ ]  Phlebitis  Pulmonary: [ ] Home oxygen, [ ] Productive cough, [ ] Bronchitis, [ ] Coughing up blood, [ ] Asthma, [ ] Wheezing  Musculoskeletal: [ ] Arthritis, [ ] Joint pain, [ ] Muscle pain  Cardiac: [ ] Chest pain, [ ] Chest tightness/pressure, [ ] Shortness of breath when lying flat, [ ] Shortness of breath with exertion, [ ] Palpitations, [ ] Heart murmur, [ ] Arrythmia, [ ] Atrial fibrillation  Hematologic: [ ] Bleeding problems, [ ] Clotting disorder, [ ] Anemia  Psychiatric:  [ ] Depression, [ ] Anxiety, [ ] Attention deficit disorder  Gastrointestinal:  [ ] Black stool,[ ]  Blood in stool, [ ] Peptic ulcer disease, [ ] Reflux, [ ] Hiatal hernia, [ ] Trouble swallowing, [ ] Diarrhea, [ ] Constipation  Urinary:  [ ] Kidney disease, [ ] Burning with urination, [ ] Frequent urination, [ ] Difficulty urinating  Skin: [ ] Ulcers, [ ] Rashes   Physical Examination  Filed Vitals:   12/26/12 1331  BP: 158/73  Pulse: 93  Resp: 16  Height: 5' 6" (1.676 m)  Weight: 248 lb (112.492 kg)  SpO2: 100%   Body mass index is 40.05 kg/(m^2).  General: A&O x 3, WD, morbidly obese   Pulmonary: Sym exp, good air movt, CTAB, no rales, rhonchi, & wheezing   Cardiac: RRR, Nl S1, S2, no Murmurs, rubs or gallops   Vascular:  Vessel  Right  Left   Radial  Palpable  Palpable   Ulnar  Palpable  Palpable   Brachial  Palpable  Palpable   Carotid  Palpable, without bruit  Palpable, without bruit   Aorta  Not palpable  N/A   Femoral  Palpable  Palpable   Popliteal  Not palpable  Not palpable   PT  Not Palpable  Not Palpable   DP  Palpable  Palpable    Gastrointestinal: soft, NTND, -G/R, - HSM, - masses, - CVAT B   Musculoskeletal: M/S 5/5 throughout , Extremities without ischemic changes   Neurologic: Pain and light touch intact in extremities except decrease sensation in both feet, Motor exam as listed above   ABI (Date: 12/26/12)  R: 1.04 (1.01), DP: tri, PT: tri  L: 1.00 (1.00), DP:  tri, PT: tri     LLE arterial duplex (Date: 12/26/12) CFA: 192-206 c/s (224 c/s) PFA: 295 c/s (248 c/s)  SFA: 168-406 c/s (ratio 2.4) Pop: 69 c/s AT: 86 c/s Pero: 63 c/s PT: 61 c/s  Medical Decision Making  Gwendolyn Grimes is a 62 y.o. 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   I continue to think that this patient has a significant degree of neuropathic pain in both legs.  She has some element of rest pain also now, in the setting of known high grade SFA stenosis.  While there are not significant interval differences between the studies, previously I felt her sx were more neuropathic in nature than today.  I don't think this patient will be a good surgical candidate, so I favor endovascular intervention on her SFA in an primary assisted patency fashion.  Based on the patient's vascular studies and examination, I have offered the patient: LLE angiogram, possible orbital atherectomy, possible angioplasty. I discussed with the patient the nature of angiographic procedures, especially the limited patencies of any endovascular intervention.  The patient is aware of that the risks of an angiographic procedure include but are not limited to: bleeding, infection, access site complications, renal failure, embolization, rupture of vessel, dissection, possible need for emergent surgical intervention, possible need for surgical procedures to treat the patient's pathology, and stroke and death.   I reiterated to the patient that I doubted that her pain would fully resolve with the intervention, but I expect some component to resolve especially if the high grade stenosis has recurred. The patient is aware of the risks and agrees to proceed.  I discussed in depth with the patient the nature of atherosclerosis, and emphasized the importance of maximal medical management including strict control of blood pressure, blood glucose, and lipid levels, antiplatelet agents,  obtaining regular exercise, and cessation of smoking.  The patient is aware that without maximal medical management the underlying atherosclerotic disease process will progress, limiting the benefit of any interventions.  Thank you for allowing us to participate in this patient's care.  Kailand Seda, MD [facility] [place] Office: [phone] Pager: [phone]  12/26/2012, 5:10 PM    

## 2013-01-08 ENCOUNTER — Other Ambulatory Visit: Payer: Self-pay | Admitting: *Deleted

## 2013-01-12 ENCOUNTER — Encounter (HOSPITAL_COMMUNITY): Payer: Self-pay | Admitting: Pharmacy Technician

## 2013-01-21 MED ORDER — SODIUM CHLORIDE 0.9 % IV SOLN
INTRAVENOUS | Status: DC
  Administered 2013-01-22: 07:00:00 via INTRAVENOUS

## 2013-01-22 ENCOUNTER — Encounter (HOSPITAL_COMMUNITY)
Admission: RE | Disposition: A | Discharge status: Home / Self Care | Payer: Self-pay | Source: Ambulatory Visit | Attending: Vascular Surgery

## 2013-01-22 ENCOUNTER — Ambulatory Visit (HOSPITAL_COMMUNITY)
Admission: RE | Admit: 2013-01-22 | Discharge: 2013-01-22 | Disposition: A | Discharge status: Home / Self Care | Payer: PRIVATE HEALTH INSURANCE | Source: Ambulatory Visit | Attending: Vascular Surgery | Admitting: Vascular Surgery

## 2013-01-22 DIAGNOSIS — Z6841 Body Mass Index (BMI) 40.0 and over, adult: Secondary | ICD-10-CM | POA: Insufficient documentation

## 2013-01-22 DIAGNOSIS — Z794 Long term (current) use of insulin: Secondary | ICD-10-CM | POA: Insufficient documentation

## 2013-01-22 DIAGNOSIS — I70209 Unspecified atherosclerosis of native arteries of extremities, unspecified extremity: Secondary | ICD-10-CM | POA: Insufficient documentation

## 2013-01-22 DIAGNOSIS — G589 Mononeuropathy, unspecified: Secondary | ICD-10-CM | POA: Insufficient documentation

## 2013-01-22 DIAGNOSIS — M79609 Pain in unspecified limb: Secondary | ICD-10-CM | POA: Insufficient documentation

## 2013-01-22 DIAGNOSIS — Z880 Allergy status to penicillin: Secondary | ICD-10-CM | POA: Insufficient documentation

## 2013-01-22 DIAGNOSIS — M109 Gout, unspecified: Secondary | ICD-10-CM | POA: Insufficient documentation

## 2013-01-22 DIAGNOSIS — E119 Type 2 diabetes mellitus without complications: Secondary | ICD-10-CM | POA: Insufficient documentation

## 2013-01-22 DIAGNOSIS — I739 Peripheral vascular disease, unspecified: Secondary | ICD-10-CM

## 2013-01-22 DIAGNOSIS — Z7982 Long term (current) use of aspirin: Secondary | ICD-10-CM | POA: Insufficient documentation

## 2013-01-22 DIAGNOSIS — IMO0002 Reserved for concepts with insufficient information to code with codable children: Secondary | ICD-10-CM | POA: Insufficient documentation

## 2013-01-22 DIAGNOSIS — Z79899 Other long term (current) drug therapy: Secondary | ICD-10-CM | POA: Insufficient documentation

## 2013-01-22 HISTORY — PX: LOWER EXTREMITY ANGIOGRAM: SHX5508

## 2013-01-22 LAB — POCT I-STAT, CHEM 8
Creatinine, Ser: 0.8 mg/dL (ref 0.50–1.10)
Glucose, Bld: 100 mg/dL — ABNORMAL HIGH (ref 70–99)
HCT: 34 % — ABNORMAL LOW (ref 36.0–46.0)
Hemoglobin: 11.6 g/dL — ABNORMAL LOW (ref 12.0–15.0)
Potassium: 4 mEq/L (ref 3.5–5.1)
Sodium: 140 mEq/L (ref 135–145)
TCO2: 25 mmol/L (ref 0–100)

## 2013-01-22 SURGERY — ANGIOGRAM, LOWER EXTREMITY
Anesthesia: LOCAL | Laterality: Left

## 2013-01-22 MED ORDER — ACETAMINOPHEN 325 MG PO TABS
ORAL_TABLET | ORAL | Status: AC
  Filled 2013-01-22: qty 2

## 2013-01-22 MED ORDER — MIDAZOLAM HCL 2 MG/2ML IJ SOLN
INTRAMUSCULAR | Status: AC
  Filled 2013-01-22: qty 2

## 2013-01-22 MED ORDER — ACETAMINOPHEN 325 MG PO TABS
650.0000 mg | ORAL_TABLET | ORAL | Status: DC | PRN
  Administered 2013-01-22: 650 mg via ORAL

## 2013-01-22 MED ORDER — LIDOCAINE HCL (PF) 1 % IJ SOLN
INTRAMUSCULAR | Status: AC
  Filled 2013-01-22: qty 30

## 2013-01-22 MED ORDER — ONDANSETRON HCL 4 MG/2ML IJ SOLN: 4.0000 mg | Freq: Four times a day (QID) | INTRAMUSCULAR | Status: DC | PRN

## 2013-01-22 MED ORDER — FENTANYL CITRATE 0.05 MG/ML IJ SOLN
INTRAMUSCULAR | Status: AC
  Filled 2013-01-22: qty 2

## 2013-01-22 MED ORDER — SODIUM CHLORIDE 0.9 % IV SOLN: 1.0000 mL/kg/h | INTRAVENOUS | Status: DC

## 2013-01-22 NOTE — Op Note (Signed)
OPERATIVE NOTE   PROCEDURE: 1.  Right common femoral artery cannulation under ultrasound guidance 2.  Bilateral pelvic angiogram 3.  Third order arterial selection 4.  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 female s/p previous angioplasty for near occlusion of left superficial femoral artery who presents with left leg claudication and neuropathic symptoms.  On surveillance duplex, she had a high grade restenosis with PSV > 400 c/s.  The patient presents for: Left leg angiogram, likely orbital atherectomy and angioplasty.  I discussed with the patient the nature of angiographic procedures, especially the limited patencies of any endovascular intervention.  The patient is aware of that the risks of an angiographic procedure include but are not limited to: bleeding, infection, access site complications, renal failure, embolization, rupture of vessel, dissection, possible need for emergent surgical intervention, possible need for surgical procedures to treat the patient's pathology, and stroke and death.  The patient is aware of the risks and agrees to proceed.  DESCRIPTION: After full informed consent was obtained from the patient, the patient was brought back to the angiography suite.  The patient was placed supine upon the angiography table  and connected to monitoring equipment.  The patient was then given conscious sedation, the amounts of which are documented in the patient's chart.  The patient was prepped and drape in the standard fashion for an angiographic procedure.  At this point, attention was turned to the right groin.  Under ultrasound guidance, the right common femoral artery was cannulated with a micropuncture needle.  The microwire was advanced into the iliac arterial system.  The needle was exchanged for a microsheath, which was loaded into the common femoral artery over the wire.  The microwire was exchanged for an Amplatz wire which was advanced into the aorta.  The microsheath was then exchanged for a 5-Fr sheath which was loaded into the common femoral artery.  The Omniflush catheter was then loaded over the wire up to the level of L3.  Due to the elevated velocities (nearly 200 c/s) in common femoral artery and profunda femoral artery, I felt a bilateral pelvic angiogram was necessary to determine if there an iliac arterial lesion.  The catheter was connected to the power injector circuit.  After de-airring and de-clotting the circuit, a power injector aortogram was completed.  The [facility] wire was replaced in the catheter, and using the [place] and Omniflush catheter, the left common iliac artery was selected.  The wire would not advance into the external iliac artery, so I exchanged it for a glidewire and advanced into the right common femoral artery.  The catheter would not advance over the aortic bifurcation so it had to be exchange for an end-hole catheter.  I advanced the catheter into the common femoral artery and did a LAO  projection of the left femoral bifurcation under magnification to evaluate the femoral arteries.  There was no obvious stenoses in any of the proximal femoral arteries.  I then selected the superficial femoral artery with the glidewire and advanced the catheter into the superficial femoral artery.  The rest  of the left leg was imaged in station.  Despite the pre-procedural arterial duplex, no hemodynamic significant stenoses were imaged.  I removed the endhole catheter.  The sheath was aspirated.  No clots were present and the sheath was reloaded with heparinized saline.    COMPLICATIONS: none  CONDITION: stable   Leonides Sake, MD [facility] [place] Office: [phone] Pager: [phone]  01/22/2013, 10:17 AM

## 2013-01-22 NOTE — H&P (View-Only) (Signed)
[facility] [place]  Established Critical Limb Ischemia Patient  History of Present Illness  Gwendolyn Grimes is a 62 y.o. ([birth year]) female who presents with chief complaint: left leg pain.  Prior procedures included: PTA L SFA (11/18/11) and 07/17/12: Dx LRo.  The patient has intermittent neuropathic pain in both legs (L>R).  She also has some sx suggestive of possible rest pain.  The patient notes symptoms have progressed.  The patient's treatment regimen currently included: maximal medical management.  The patient denies any wounds  Past Medical History  Diagnosis Date  . Peripheral vascular disease   . Pain in limb   . Arthritis     gout  . Neuropathy   . Diabetes mellitus     Past Surgical History  Procedure Laterality Date  . Spine surgery    . Abdominal hysterectomy    . Vascular vein    . Back surgery  2009  . Pulmonary embolism surgery  November 18, 2011    Left leg  . Tooth extraction  Dec 22, 2012    X's 2    History   Social History  . Marital Status: Single    Spouse Name: N/A    Number of Children: N/A  . Years of Education: N/A   Occupational History  . Not on file.   Social History Main Topics  . Smoking status: Never Smoker   . Smokeless tobacco: Never Used  . Alcohol Use: No  . Drug Use: No  . Sexually Active: Not on file   Other Topics Concern  . Not on file   Social History Narrative  . No narrative on file    Family History  Problem Relation Age of Onset  . Diabetes Other   . Cancer Other   . Heart disease Other   . Diabetes Mother   . Hypertension Mother   . Cancer Father     PROSTATE  . Diabetes Sister     Current Outpatient Prescriptions on File Prior to Visit  Medication Sig Dispense Refill  . albuterol (PROVENTIL HFA;VENTOLIN HFA) 108 (90 BASE) MCG/ACT inhaler Inhale 2 puffs into the lungs every 4 (four) hours as needed. For shortness of breath      . aspirin EC 81 MG tablet Take 81 mg by mouth daily.       . clopidogrel (PLAVIX) 75 MG tablet Take 1 tablet (75 mg total) by mouth daily.  30 tablet  11  . fluconazole (DIFLUCAN) 150 MG tablet       . Fluticasone-Salmeterol (ADVAIR DISKUS) 100-50 MCG/DOSE AEPB Inhale 1 puff into the lungs every 12 (twelve) hours.      Marland Kitchen glyBURIDE-metformin (GLUCOVANCE) 5-500 MG per tablet Take 1 tablet by mouth 2 (two) times daily with a meal.       . insulin aspart (NOVOLOG) 100 UNIT/ML injection Inject 20 Units into the skin every morning. As directed      . insulin detemir (LEVEMIR) 100 UNIT/ML injection Inject 60 Units into the skin at bedtime. As directed      . Multiple Vitamin (MULITIVITAMIN WITH MINERALS) TABS Take 1 tablet by mouth daily.      Marland Kitchen NOVOFINE 30G X 8 MM MISC       . potassium chloride SA (K-DUR,KLOR-CON) 20 MEQ tablet Take 40 mEq by mouth daily.       . ranitidine (ZANTAC) 150 MG capsule Take 150 mg by mouth daily. As needed for acid reflux.      Marland Kitchen  terbinafine (LAMISIL) 250 MG tablet Take 250 mg by mouth daily.      . traMADol (ULTRAM) 50 MG tablet Take 1 tablet (50 mg total) by mouth every 6 (six) hours as needed for pain.  30 tablet  0  . traMADol (ULTRAM) 50 MG tablet Take 1 tablet (50 mg total) by mouth every 6 (six) hours as needed for pain.  30 tablet  0   No current facility-administered medications on file prior to visit.    Allergies  Allergen Reactions  . Penicillins Hives and Itching    Review of Systems (Positive items checked otherwise negative)  General: [ ]  Weight loss, [ ]  Weight gain, [ ]   Loss of appetite, [ ]  Fever  Neurologic: [ ]  Dizziness, [ ]  Blackouts, [ ]  Headaches, [ ]  Seizure, [x]  numbness and paraesthesia in legs  Ear/Nose/Throat: [ ]  Change in eyesight, [ ]  Change in hearing, [ ]  Nose bleeds, [ ]  Sore throat  Vascular: [x]  Pain in legs with walking, [x]  Pain in feet while lying flat, [ ]  Non-healing ulcer, [ ]  Stroke, [ ]  "Mini stroke", [ ]  Slurred speech, [ ]  Temporary blindness, [ ]  Blood clot in vein, [ ]   Phlebitis  Pulmonary: [ ]  Home oxygen, [ ]  Productive cough, [ ]  Bronchitis, [ ]  Coughing up blood, [ ]  Asthma, [ ]  Wheezing  Musculoskeletal: [ ]  Arthritis, [ ]  Joint pain, [ ]  Muscle pain  Cardiac: [ ]  Chest pain, [ ]  Chest tightness/pressure, [ ]  Shortness of breath when lying flat, [ ]  Shortness of breath with exertion, [ ]  Palpitations, [ ]  Heart murmur, [ ]  Arrythmia, [ ]  Atrial fibrillation  Hematologic: [ ]  Bleeding problems, [ ]  Clotting disorder, [ ]  Anemia  Psychiatric:  [ ]  Depression, [ ]  Anxiety, [ ]  Attention deficit disorder  Gastrointestinal:  [ ]  Black stool,[ ]   Blood in stool, [ ]  Peptic ulcer disease, [ ]  Reflux, [ ]  Hiatal hernia, [ ]  Trouble swallowing, [ ]  Diarrhea, [ ]  Constipation  Urinary:  [ ]  Kidney disease, [ ]  Burning with urination, [ ]  Frequent urination, [ ]  Difficulty urinating  Skin: [ ]  Ulcers, [ ]  Rashes   Physical Examination  Filed Vitals:   12/26/12 1331  BP: 158/73  Pulse: 93  Resp: 16  Height: 5\' 6"  (1.676 m)  Weight: 248 lb (112.492 kg)  SpO2: 100%   Body mass index is 40.05 kg/(m^2).  General: A&O x 3, WD, morbidly obese   Pulmonary: Sym exp, good air movt, CTAB, no rales, rhonchi, & wheezing   Cardiac: RRR, Nl S1, S2, no Murmurs, rubs or gallops   Vascular:  Vessel  Right  Left   Radial  Palpable  Palpable   Ulnar  Palpable  Palpable   Brachial  Palpable  Palpable   Carotid  Palpable, without bruit  Palpable, without bruit   Aorta  Not palpable  N/A   Femoral  Palpable  Palpable   Popliteal  Not palpable  Not palpable   PT  Not Palpable  Not Palpable   DP  Palpable  Palpable    Gastrointestinal: soft, NTND, -G/R, - HSM, - masses, - CVAT B   Musculoskeletal: M/S 5/5 throughout , Extremities without ischemic changes   Neurologic: Pain and light touch intact in extremities except decrease sensation in both feet, Motor exam as listed above   ABI (Date: 12/26/12)  R: 1.04 (1.01), DP: tri, PT: tri  L: 1.00 (1.00), DP:  tri, PT: tri  LLE arterial duplex (Date: 12/26/12) CFA: 192-206 c/s (224 c/s) PFA: 295 c/s (248 c/s)  SFA: 168-406 c/s (ratio 2.4) Pop: 69 c/s AT: 86 c/s Pero: 63 c/s PT: 61 c/s  Medical Decision Making  Gwendolyn Grimes is a 62 y.o. 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   I continue to think that this patient has a significant degree of neuropathic pain in both legs.  She has some element of rest pain also now, in the setting of known high grade SFA stenosis.  While there are not significant interval differences between the studies, previously I felt her sx were more neuropathic in nature than today.  I don't think this patient will be a good surgical candidate, so I favor endovascular intervention on her SFA in an primary assisted patency fashion.  Based on the patient's vascular studies and examination, I have offered the patient: LLE angiogram, possible orbital atherectomy, possible angioplasty. I discussed with the patient the nature of angiographic procedures, especially the limited patencies of any endovascular intervention.  The patient is aware of that the risks of an angiographic procedure include but are not limited to: bleeding, infection, access site complications, renal failure, embolization, rupture of vessel, dissection, possible need for emergent surgical intervention, possible need for surgical procedures to treat the patient's pathology, and stroke and death.   I reiterated to the patient that I doubted that her pain would fully resolve with the intervention, but I expect some component to resolve especially if the high grade stenosis has recurred. The patient is aware of the risks and agrees to proceed.  I discussed in depth with the patient the nature of atherosclerosis, and emphasized the importance of maximal medical management including strict control of blood pressure, blood glucose, and lipid levels, antiplatelet agents,  obtaining regular exercise, and cessation of smoking.  The patient is aware that without maximal medical management the underlying atherosclerotic disease process will progress, limiting the benefit of any interventions.  Thank you for allowing [place] to participate in this patient's care.  Leonides Sake, MD [facility] [place] Office: [phone] Pager: [phone]  12/26/2012, 5:10 PM

## 2013-01-22 NOTE — Interval H&P Note (Signed)
[facility]   History and Physical Update  The patient was interviewed and re-examined.  The patient's previous History and Physical has been reviewed and is unchanged.  There is no change in the plan of care: LLE angiogram, possible OA and PTA L SFA.  Leonides Sake, MD [facility] [place] Office: [phone] Pager: [phone]  01/22/13 0900

## 2013-01-23 ENCOUNTER — Telehealth: Payer: Self-pay | Admitting: Vascular Surgery

## 2013-01-23 NOTE — Telephone Encounter (Signed)
Message copied by Sara Chu on Fri Jan 23, 2013 11:13 AM ------      Message from: Melene Plan      Created: Thu Jan 22, 2013  3:11 PM                   ----- Message -----         From: [name], MD         Sent: 01/22/2013  10:32 AM           To: Reuel Derby, Melene Plan, RN            Gwendolyn Grimes      [phone]      [birth year]            PROCEDURE:      1.  Right common femoral artery cannulation under ultrasound guidance      2.  Bilateral pelvic angiogram      3.  Third order arterial selection      4.  Left leg runoff            Follow-up: 4 weeks ------

## 2013-02-20 ENCOUNTER — Ambulatory Visit: Payer: PRIVATE HEALTH INSURANCE | Admitting: Vascular Surgery

## 2013-03-04 IMAGING — CR DG CHEST 2V
2 series · 2 of 2 positions shown · non-contrast
Comparison: None.

CLINICAL DATA: Preoperative respiratory exam.  Peripheral vascular
disease.

CHEST - 2 VIEW

[w chest lat]
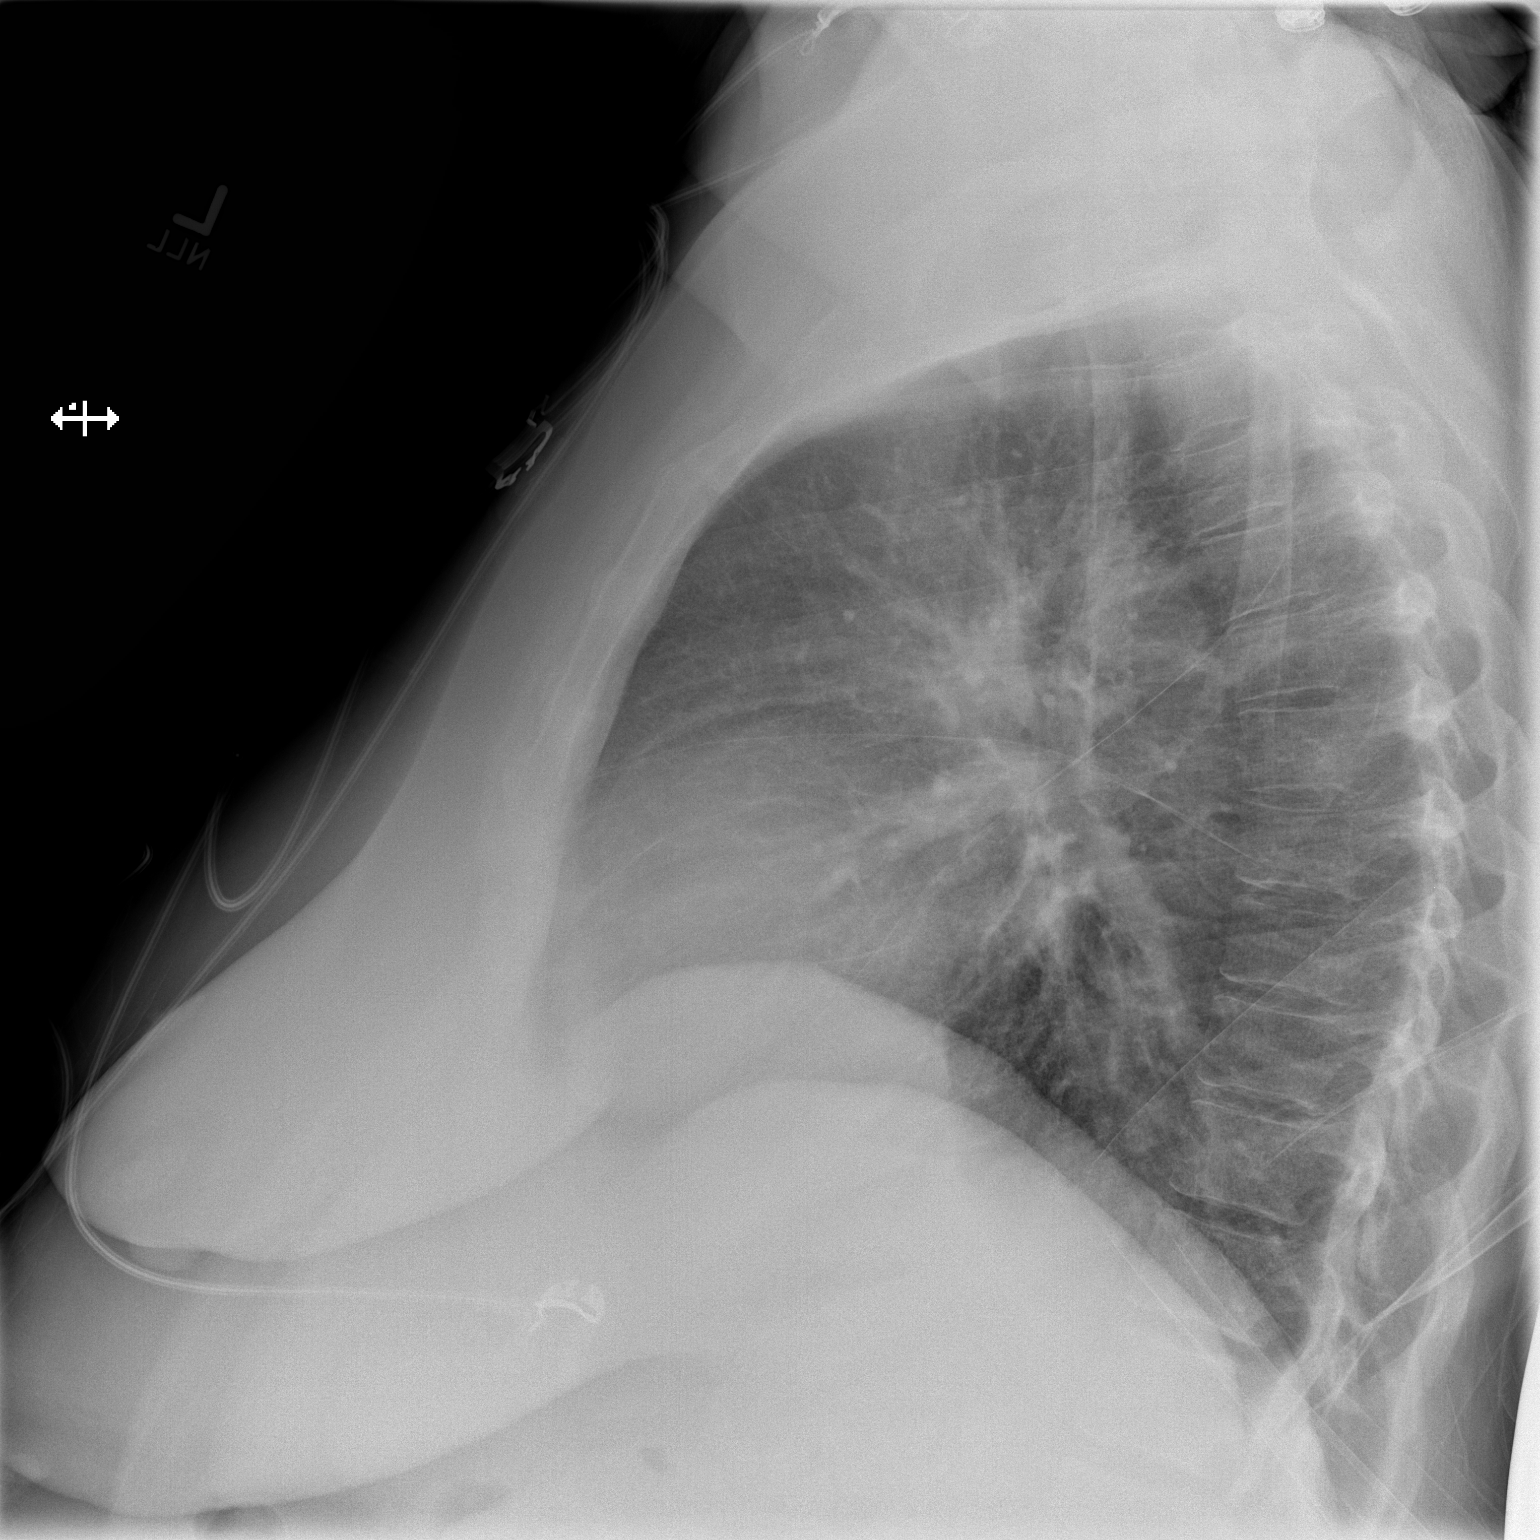

[x chest ap]
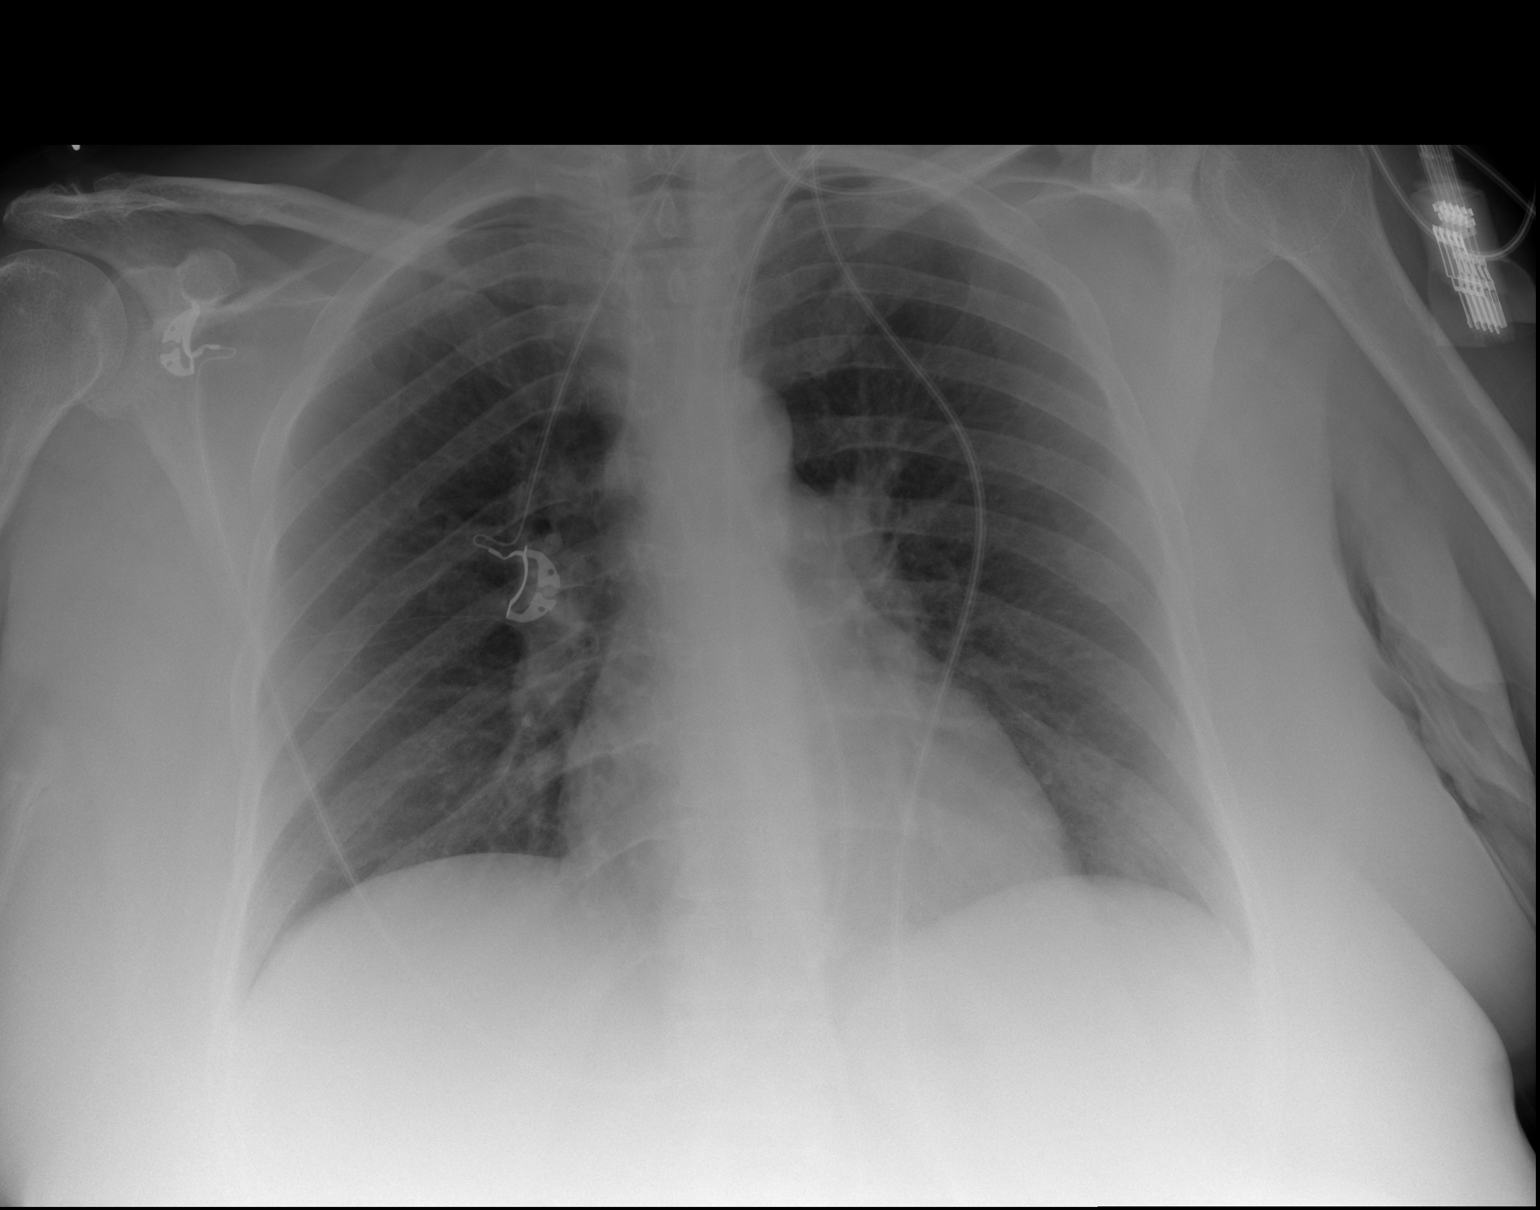

[2 of 2 positions shown; findings below may reference images not displayed]

FINDINGS: Heart size and pulmonary vascularity are normal and the
lungs are clear.  No osseous abnormality.
IMPRESSION: Normal chest.

## 2013-03-05 ENCOUNTER — Encounter: Payer: Self-pay | Admitting: Vascular Surgery

## 2013-03-06 ENCOUNTER — Other Ambulatory Visit: Payer: Self-pay | Admitting: *Deleted

## 2013-03-06 ENCOUNTER — Ambulatory Visit (INDEPENDENT_AMBULATORY_CARE_PROVIDER_SITE_OTHER): Payer: PRIVATE HEALTH INSURANCE | Admitting: Vascular Surgery

## 2013-03-06 ENCOUNTER — Encounter: Payer: Self-pay | Admitting: Vascular Surgery

## 2013-03-06 VITALS — BP 144/60 | HR 89 | Ht 66.0 in | Wt 249.5 lb

## 2013-03-06 DIAGNOSIS — I739 Peripheral vascular disease, unspecified: Secondary | ICD-10-CM

## 2013-03-06 DIAGNOSIS — Z48812 Encounter for surgical aftercare following surgery on the circulatory system: Secondary | ICD-10-CM

## 2013-03-06 DIAGNOSIS — M79609 Pain in unspecified limb: Secondary | ICD-10-CM

## 2013-03-06 IMAGING — CT CT HEAD W/O CM
1 of 2 series · 13 of 30 positions shown, 17 images · non-contrast
Comparison: None.

CLINICAL DATA: Rule out CVA.  Left leg weakness

CT HEAD WITHOUT CONTRAST
TECHNIQUE: Contiguous axial images were obtained from the base of
the skull through the vertex without contrast.

[Series 2: brain · axial · 0.47mm/px · z∈[+168,+299]mm · 13 of 28 slices shown, 17 images]
[im 2/28  brain]
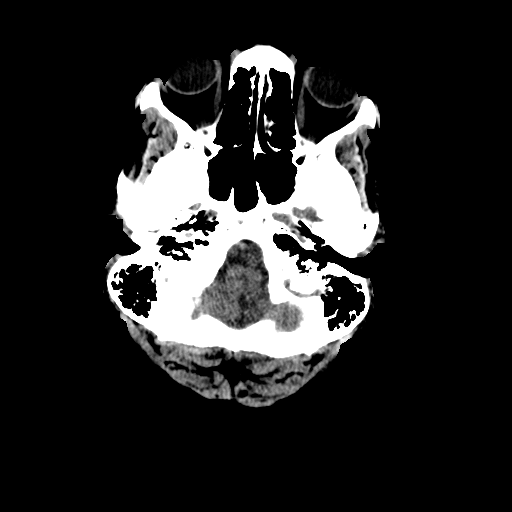
[im 2/28  bone]
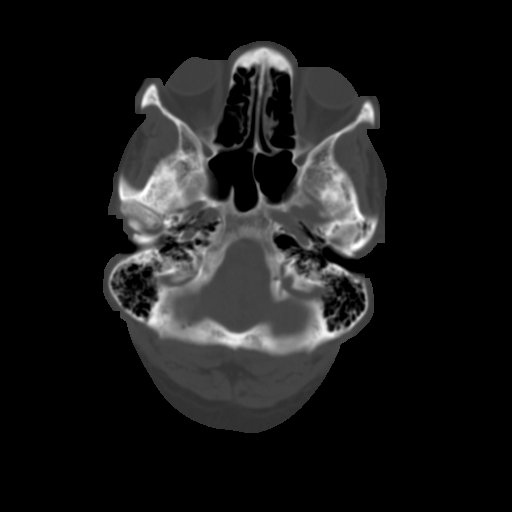
[im 4/28  brain]
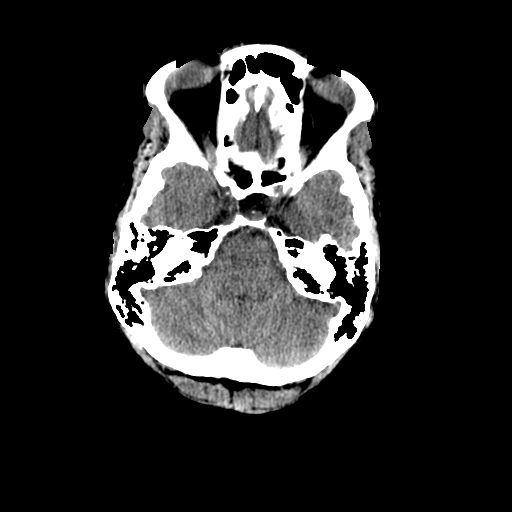
[im 6/28  brain]
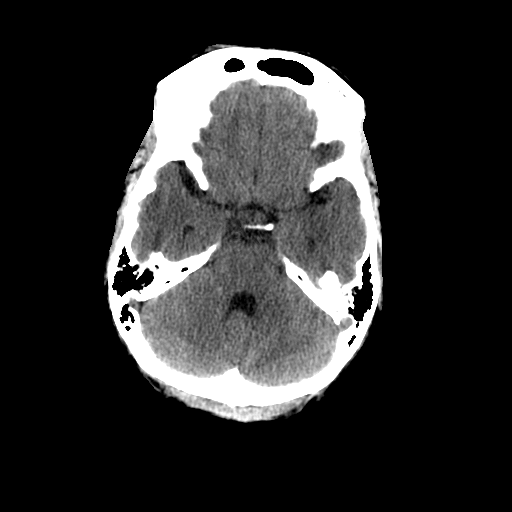
[im 8/28  brain]
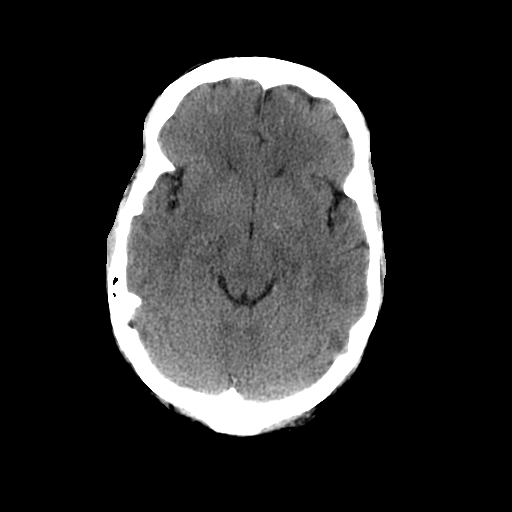
[im 10/28  brain]
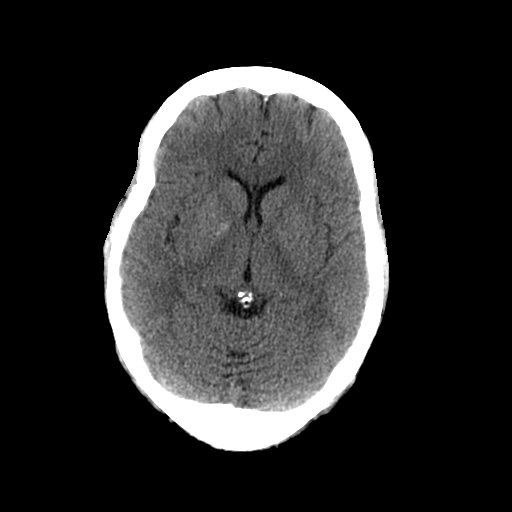
[im 10/28  bone]
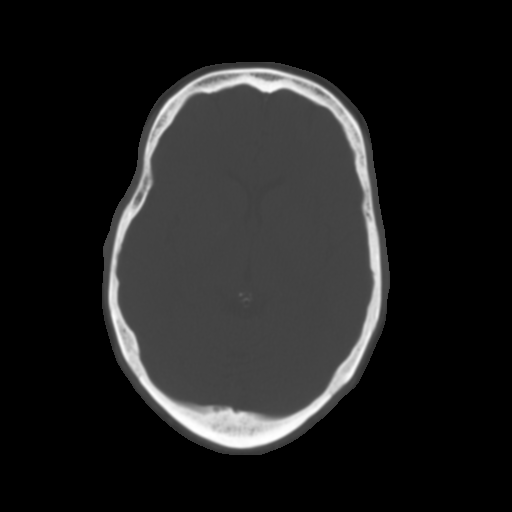
[im 12/28  brain]
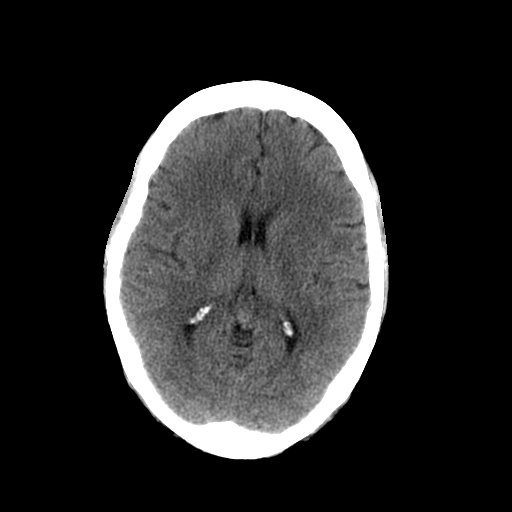
[im 14/28  brain]
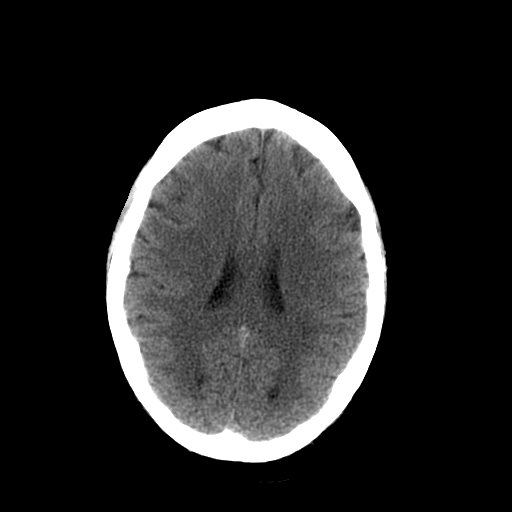
[im 16/28  brain]
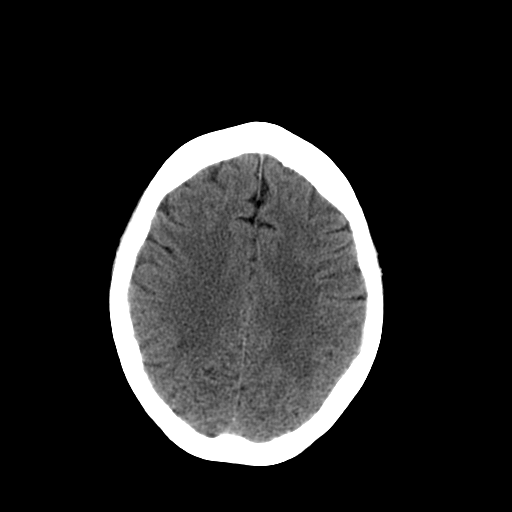
[im 18/28  brain]
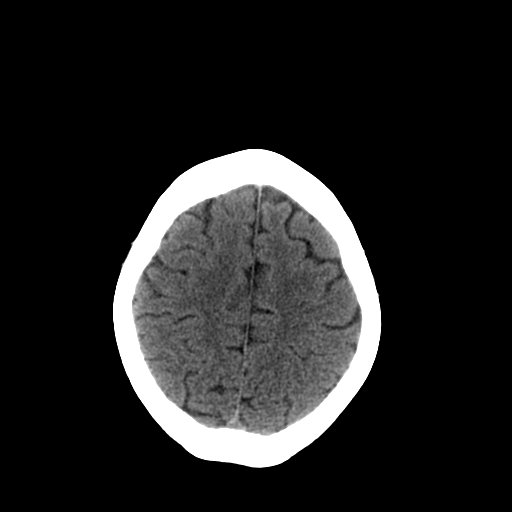
[im 18/28  bone]
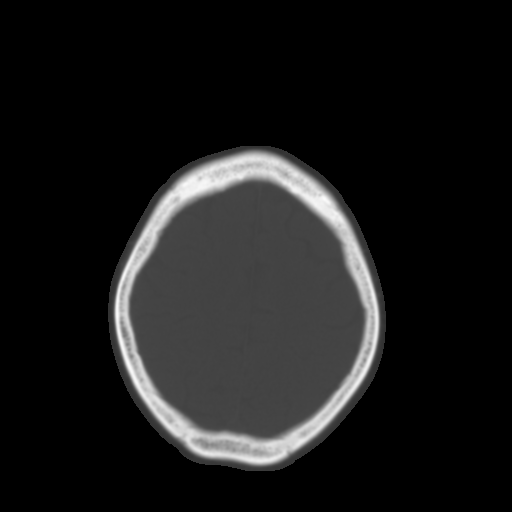
[im 20/28  brain]
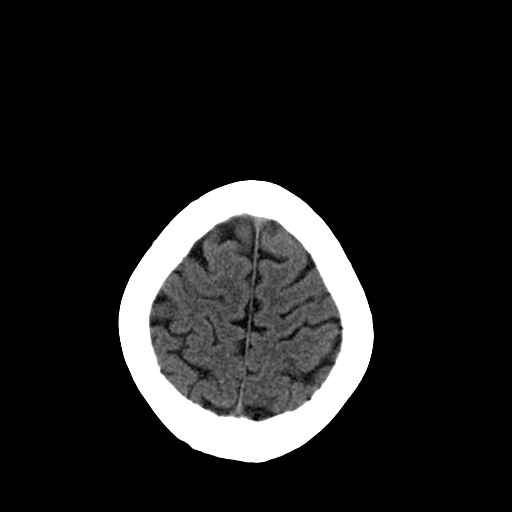
[im 22/28  brain]
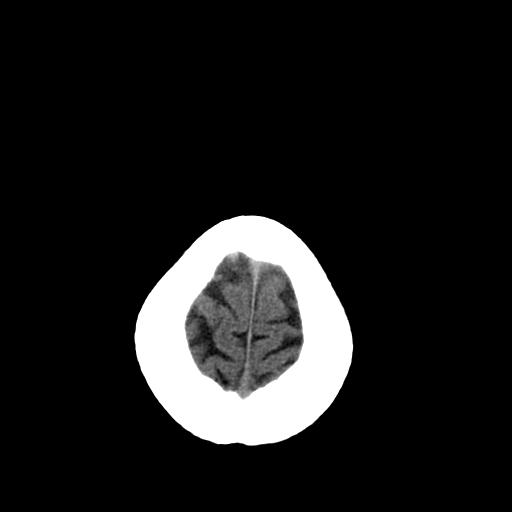
[im 24/28  brain]
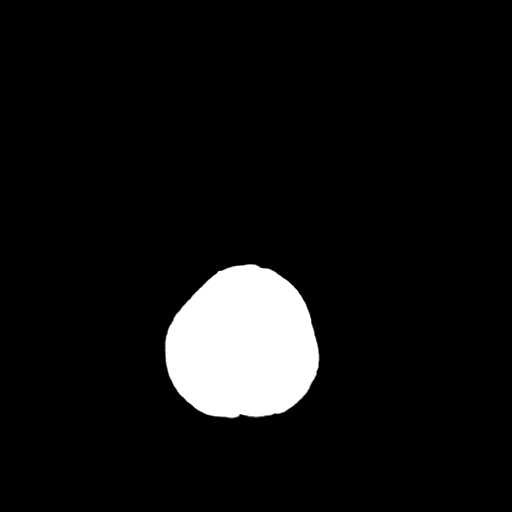
[im 26/28  brain]
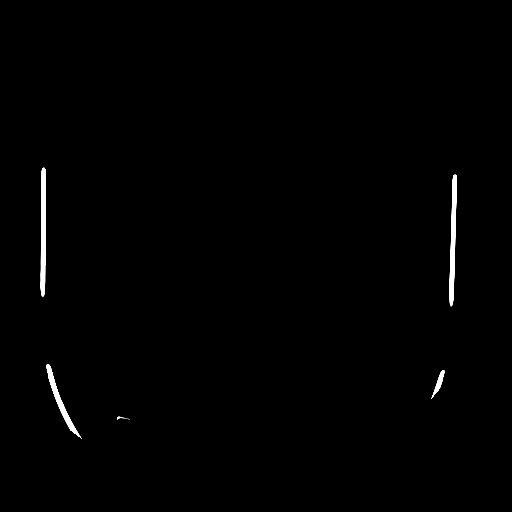
[im 26/28  bone]
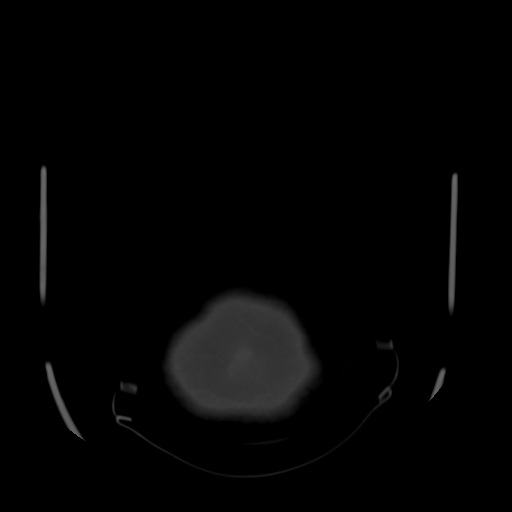

[13 of 30 positions shown; findings below may reference images not displayed]

FINDINGS: Ventricle size is normal.  Mild patchy hypodensity in the
cerebral white matter bilaterally, suggestive of chronic
microvascular ischemia.  No acute infarct.  Negative for hemorrhage
or mass.  Calvarium is intact.
IMPRESSION: No acute abnormality.  Probable mild chronic microvascular ischemia
in the white matter.

## 2013-03-06 NOTE — Progress Notes (Signed)
[facility] [place]  Postoperative Visit  History of Present Illness  Gwendolyn Grimes is a 62 y.o. female who presents for postoperative follow-up from procedure on Date: 01/22/13: Dx LRo .  The patient notes no resolution of lower extremity symptoms.  The patient is able to complete their activities of daily living.  The patient's current symptoms are: aching lateral right leg that radiates from hip down and neuropathic sx in both legs.  Past Medical History, Past Surgical History, Social History, Family History, Medications, Allergies, and Review of Systems are unchanged from previous evaluation on 01/22/13.  On ROS:  Continued neuropathic sx, no gangrene or ulcers  For VQI Use Only  PRE-ADM LIVING: Home  AMB STATUS: Ambulatory  Physical Examination  Filed Vitals:   03/06/13 0840  BP: 144/60  Pulse: 89  Height: 5\' 6"  (1.676 m)  Weight: 249 lb 8 oz (113.172 kg)  SpO2: 100%   Body mass index is 40.29 kg/(m^2).  General: A&O x 3, WD, morbid obesity  Pulmonary: Sym exp, good air movt, CTAB, no rales, rhonchi, & wheezing  Cardiac: RRR, Nl S1, S2, no Murmurs, rubs or gallops  Vascular: Vessel Right Left  Femoral Palpable Palpable  Popliteal Not palpable Not palpable  PT Palpable Palpable  DP Palpable Palpable   Gastrointestinal: soft, NTND, -G/R, - HSM, - masses, - CVAT B  Musculoskeletal: M/S 5/5 throughout , Extremities without ischemic changes , right groin without hematoma, no echymosis, bruit or thrill present at cannulation site  Neurologic:  Pain and light touch intact in extremities , Motor exam as listed above  Medical Decision Making  Gwendolyn Grimes is a 62 y.o. female who presents s/p LRo, h/o L SFA PTA for near occlusion Pt are not consistent with vasculogenic claudication as she has triphasic blood flow in all tibial arteries.   Further work-up with her primary care physician recommended to determine etiology of this patient's  symptoms. Based on his angiographic findings, this patient needs: q6 BLE ABI and LLE arterial duplex. I discussed in depth with the patient the nature of atherosclerosis, and emphasized the importance of maximal medical management including strict control of blood pressure, blood glucose, and lipid levels, obtaining regular exercise, and cessation of smoking.   The patient is aware that without maximal medical management the underlying atherosclerotic disease process will progress, limiting the benefit of any interventions. The patient is currently on a statin: Zocor.  The patient is currently on an anti-platelet: ASA.   Thank you for allowing [place] to participate in this patient's care.  Leonides Sake, MD [facility] [place] Office: [phone] Pager: [phone]

## 2013-03-30 ENCOUNTER — Other Ambulatory Visit: Payer: Self-pay | Admitting: *Deleted

## 2013-03-30 DIAGNOSIS — I739 Peripheral vascular disease, unspecified: Secondary | ICD-10-CM

## 2013-03-30 MED ORDER — CLOPIDOGREL BISULFATE 75 MG PO TABS: 75.0000 mg | ORAL_TABLET | Freq: Every day | ORAL | Status: AC

## 2013-03-31 ENCOUNTER — Other Ambulatory Visit: Payer: Self-pay | Admitting: *Deleted

## 2013-06-05 ENCOUNTER — Telehealth: Payer: Self-pay

## 2013-06-05 NOTE — Telephone Encounter (Addendum)
Pt. called to report 2 week hx. of decreased sensation in her feet.  States the symptoms started in her left foot, and now have moved to her right foot.  Reports her feet feel cold; states "I can't feel my feet."  Reports "left leg wants to give-out" when she stands.  C/o a "drawing sensation" in her toes that moves up to her calves when she stands.   States her left great toe and 2nd toe are a blue/ black color.   Denies open sores.  Discussed with Dr. Imogene Burn.  Recommends pt. be evaluated by her PCP for back/ spine problems.  Also advised to have pt's 6 mo. appt. moved-up to evaluate PVD.  Ret'd call to pt.; advised to schedule appt. for evaluation of above symptoms with her PCP, and to expect a call from our office to schedule an earlier appt. for evaluation of PVD.  Verb/ understanding.

## 2013-06-09 ENCOUNTER — Other Ambulatory Visit: Payer: Self-pay | Admitting: *Deleted

## 2013-06-09 ENCOUNTER — Encounter: Payer: Self-pay | Admitting: Family

## 2013-06-09 DIAGNOSIS — M79609 Pain in unspecified limb: Secondary | ICD-10-CM

## 2013-06-10 ENCOUNTER — Ambulatory Visit: Payer: PRIVATE HEALTH INSURANCE | Admitting: Family

## 2013-06-10 ENCOUNTER — Inpatient Hospital Stay (HOSPITAL_COMMUNITY): Admission: RE | Admit: 2013-06-10 | Payer: PRIVATE HEALTH INSURANCE | Source: Ambulatory Visit

## 2013-09-11 ENCOUNTER — Encounter (HOSPITAL_COMMUNITY): Payer: PRIVATE HEALTH INSURANCE

## 2013-09-11 ENCOUNTER — Ambulatory Visit: Payer: PRIVATE HEALTH INSURANCE | Admitting: Family

## 2014-03-02 ENCOUNTER — Other Ambulatory Visit: Payer: Self-pay

## 2014-03-02 ENCOUNTER — Telehealth: Payer: Self-pay

## 2014-03-02 DIAGNOSIS — I739 Peripheral vascular disease, unspecified: Secondary | ICD-10-CM

## 2014-03-02 DIAGNOSIS — R230 Cyanosis: Secondary | ICD-10-CM

## 2014-03-02 NOTE — Telephone Encounter (Signed)
I offered an appointment to Gwendolyn Grimes for 03/05/14 @ 1:00. She insisted that there is no way she can come this week, as she is scheduled on the 23rd for a colonoscopy. I explained that it was advised for her to be examined this week due to the changes that she reported. Pt again declined appointment and asked to be scheduled on 03/12/14. dpm

## 2014-03-02 NOTE — Telephone Encounter (Signed)
Phone call from pt.  Stated she has had increased discoloration of the left great and 2nd toe, and of the side of the right great toe; reports a "bluish-black" discoloration.  Stated she noticed this change in color approx. 9 days ago.  Denies any open sores.  Denies pain in the toes or lower extremities.  Reported the left leg becomes weaker with walking very far, and begins to feel heavy.  Denies pain with walking.  Stated approx. 1 week ago, the left foot "felt like there was a rock underneath while walking", and stated "that feeling comes and goes." Denies any swelling of lower extremities at this time.   Reported she has been soaking her feet in clorox bleach and water; was advised to do this by a family member.  Advised to stop soaking her feet in clorox water.  Encouraged her to use dial soap and warm water for foot soaks. Advised will sched. an appt. for evaluation of her PAD.  Stated she missed her last appt. in January due to transportation problems.

## 2014-03-05 ENCOUNTER — Ambulatory Visit: Payer: PRIVATE HEALTH INSURANCE | Admitting: Family

## 2014-03-05 ENCOUNTER — Other Ambulatory Visit (HOSPITAL_COMMUNITY): Payer: PRIVATE HEALTH INSURANCE

## 2014-03-05 ENCOUNTER — Encounter (HOSPITAL_COMMUNITY): Payer: PRIVATE HEALTH INSURANCE

## 2014-03-11 ENCOUNTER — Encounter: Payer: Self-pay | Admitting: Family

## 2014-03-12 ENCOUNTER — Ambulatory Visit (HOSPITAL_COMMUNITY)
Admission: RE | Admit: 2014-03-12 | Discharge: 2014-03-12 | Disposition: A | Discharge status: Home / Self Care | Payer: PRIVATE HEALTH INSURANCE | Source: Ambulatory Visit | Attending: Family | Admitting: Family

## 2014-03-12 ENCOUNTER — Ambulatory Visit (INDEPENDENT_AMBULATORY_CARE_PROVIDER_SITE_OTHER)
Admission: RE | Admit: 2014-03-12 | Discharge: 2014-03-12 | Disposition: A | Discharge status: Home / Self Care | Payer: PRIVATE HEALTH INSURANCE | Source: Ambulatory Visit | Attending: Family | Admitting: Family

## 2014-03-12 ENCOUNTER — Encounter: Payer: Self-pay | Admitting: Family

## 2014-03-12 ENCOUNTER — Ambulatory Visit (INDEPENDENT_AMBULATORY_CARE_PROVIDER_SITE_OTHER): Payer: PRIVATE HEALTH INSURANCE | Admitting: Family

## 2014-03-12 VITALS — BP 178/82 | HR 88 | Resp 16 | Ht 66.0 in | Wt 235.0 lb

## 2014-03-12 DIAGNOSIS — I70219 Atherosclerosis of native arteries of extremities with intermittent claudication, unspecified extremity: Secondary | ICD-10-CM

## 2014-03-12 DIAGNOSIS — I739 Peripheral vascular disease, unspecified: Secondary | ICD-10-CM | POA: Diagnosis not present

## 2014-03-12 DIAGNOSIS — R230 Cyanosis: Secondary | ICD-10-CM

## 2014-03-12 DIAGNOSIS — M79605 Pain in left leg: Secondary | ICD-10-CM

## 2014-03-12 DIAGNOSIS — M79609 Pain in unspecified limb: Secondary | ICD-10-CM

## 2014-03-12 DIAGNOSIS — M7989 Other specified soft tissue disorders: Secondary | ICD-10-CM

## 2014-03-12 NOTE — Progress Notes (Signed)
[facility] [place] HISTORY AND PHYSICAL -PAD  History of Present Illness Gwendolyn Grimes is a 63 y.o. female patient of Dr. Imogene Burn who had a right common femoral artery cannulation under ultrasound guidance, bilateral pelvic angiogram, third order arterial selection, and left leg runoff on 01/22/13 by Dr. Imogene Burn. She returns today with c/o weakness in left leg since February 01, 2014, after she took a trip, worse at night, painful for sheet to touch leg, relieved by Lortab or Plavix, denies non healing wounds to either foot or leg. Feels like something is crawling on left calf every time she walks. Her left lower leg was swollen and hot last Sunday until she propped up her legs which resolved overnight, denies any further warm or heavy feeling in left lower leg. She has had a cough for about 2 years, worse at night, states she has COPD. She denies history of stroke or TIA. The patient denies New Medical or Surgical History. Reports she lost 10 pounds in the last 4-5 months without effort, states loose bowel movements since last Saturday, denies dysphagia, denies anxiety, reports fluttering heart feeling at times for the last 2-3 years, has not noticed her enlarged thyroid.  Pt Diabetic: Yes, uncontrolled per pt, insulin dependent  Pt smoker: smoker  (2 cigarettes/day, started at age 7)  Pt meds include: Statin :Yes ASA: Yes Other anticoagulants/antiplatelets: Plavix  Past Medical History  Diagnosis Date  . Peripheral vascular disease   . Pain in limb   . Arthritis     gout  . Neuropathy   . Diabetes mellitus   . COPD (chronic obstructive pulmonary disease)     Social History History  Substance Use Topics  . Smoking status: Current Some Day Smoker -- 0.50 packs/day    Types: Cigarettes  . Smokeless tobacco: Never Used  . Alcohol Use: No    Family History Family History  Problem Relation Age of Onset  . Diabetes Other   . Cancer Other   . Heart disease Other    . Diabetes Mother   . Hypertension Mother   . Heart disease Mother     Before age 60  . Cancer Father     PROSTATE  . Diabetes Sister   . Hypertension Sister     Past Surgical History  Procedure Laterality Date  . Spine surgery    . Abdominal hysterectomy    . Vascular vein    . Back surgery  2009  . Pulmonary embolism surgery  November 18, 2011    Left leg  . Tooth extraction  Dec 22, 2012    X's 2    Allergies  Allergen Reactions  . Penicillins Hives and Itching    Current Outpatient Prescriptions  Medication Sig Dispense Refill  . albuterol (PROVENTIL HFA;VENTOLIN HFA) 108 (90 BASE) MCG/ACT inhaler Inhale 2 puffs into the lungs every 4 (four) hours as needed. For shortness of breath      . aspirin EC 81 MG tablet Take 81 mg by mouth daily.      . benazepril (LOTENSIN) 40 MG tablet       . clopidogrel (PLAVIX) 75 MG tablet Take 1 tablet (75 mg total) by mouth daily.  90 tablet  3  . glyBURIDE-metformin (GLUCOVANCE) 5-500 MG per tablet Take 2 tablets by mouth 2 (two) times daily with a meal.       . insulin detemir (LEVEMIR) 100 UNIT/ML injection Inject 60 Units into the skin at bedtime. As directed      .  lisinopril (PRINIVIL,ZESTRIL) 40 MG tablet Take 40 mg by mouth daily.      Marland Kitchen NOVOFINE 30G X 8 MM MISC       . NOVOLOG MIX 70/30 FLEXPEN (70-30) 100 UNIT/ML Pen       . potassium chloride (KLOR-CON) 20 MEQ packet Take 20 mEq by mouth 2 (two) times daily.      . potassium chloride SA (K-DUR,KLOR-CON) 20 MEQ tablet Take 40 mEq by mouth daily.       . ranitidine (ZANTAC) 150 MG capsule Take 150 mg by mouth daily. As needed for acid reflux.      . simvastatin (ZOCOR) 5 MG tablet Take 5 mg by mouth at bedtime.      . clopidogrel (PLAVIX) 75 MG tablet Take 1 tablet (75 mg total) by mouth daily.  90 tablet  3  . Fluticasone-Salmeterol (ADVAIR DISKUS) 100-50 MCG/DOSE AEPB Inhale 1 puff into the lungs every 12 (twelve) hours.      Marland Kitchen ibuprofen (ADVIL,MOTRIN) 400 MG tablet       .  insulin aspart (NOVOLOG) 100 UNIT/ML injection Inject 20 Units into the skin every morning. As directed       No current facility-administered medications for this visit.    ROS: See HPI for pertinent positives and negatives.   Physical Examination  Filed Vitals:   03/12/14 1449  BP: 178/82  Pulse: 88  Resp: 16  Height: 5\' 6"  (1.676 m)  Weight: 235 lb (106.595 kg)  SpO2: 100%   Body mass index is 37.95 kg/(m^2).  General: A&O x 3, WDWN, obese female. Neck: Thyromegaly, right lobe larger than left and is about 2-3 times normal size. Left lobe is about 1.5 times normal size. Gait: limp, using cane Eyes: PERRLA. Pulmonary: CTAB, without wheezes , rales or rhonchi. Cardiac: regular Rythm , without detected murmur.         Carotid Bruits Right Left   Negative Negative   Radial pulses: are 2+ palpable and =                           VASCULAR EXAM: Extremities without ischemic changes  without Gangrene; without open wounds. No edema, swelling, nor erythema in legs.                                                                                                          LE Pulses Right Left       POPLITEAL  not palpable   not palpable       POSTERIOR TIBIAL   palpable    palpable        DORSALIS PEDIS      ANTERIOR TIBIAL  palpable   palpable    Abdomen: soft, NT, no masses. Skin: no rashes, no ulcers noted. Musculoskeletal: no muscle wasting or atrophy. No edema, swelling, nor erythema in legs.  Neurologic: A&O X 3; Appropriate Affect ; SENSATION: normal; MOTOR FUNCTION:  moving all extremities equally, motor strength 5/5 throughout except left leg is 4/5. Speech is  fluent/normal. CN 2-12 intact.   Non-Invasive Vascular Imaging: DATE: 03/12/2014 LOWER EXTREMITY ARTERIAL DUPLEX EVALUATION    INDICATION: PVD    PREVIOUS INTERVENTION(S):     DUPLEX EXAM: Lower extremity arterial duplex    RIGHT  LEFT   Peak Systolic Velocity (cm/s) Ratio (if abnormal) Waveform   Peak Systolic Velocity (cm/s) Ratio (if abnormal) Waveform     Common Femoral Artery 155  T     Deep Femoral Artery 207  B     Superficial Femoral Artery Proximal 190 / 314 1.65 T     Superficial Femoral Artery Mid 195  T     Superficial Femoral Artery Distal 187  T     Popliteal Artery 102  T     Posterior Tibial Artery Dist 104  B     Anterior Tibial Artery Distal 89  B     Peroneal Artery Distal NV  -  .96 Today's ABI / TBI 1.15  1.04 Previous ABI / TBI ( 12/26/12 ) 1.0    Waveform:    M - Monophasic       B - Biphasic       T - Triphasic  If Ankle Brachial Index (ABI) or Toe Brachial Index (TBI) performed, please see complete report     ADDITIONAL FINDINGS:     IMPRESSION: 1. No evidence of significant left lower extremity arterial occlusive disease.    Compared to the previous exam:  Unable to obtain increased velocity in the left superficial femoral artery as obtained on prior exam.   LOWER EXTREMITY VENOUS DUPLEX EVALUATION    INDICATION: Left leg pain and swelling    PREVIOUS INTERVENTION(S):     DUPLEX EXAM:      Common Femoral Vein  Femoral Vein Popliteal Vein Posterior Tibial Vein  Peroneal Vein Great Saphenous Vein   Right Left Right Left Right Left Right Left Right Left Right Left  Spontaneous + +  +  +  +  +  +  Phasic + +  +  +  +  +  +  Compressible + +  +  +  +  +  +  Augmentation + +  +  +  +  +  +  Competent                 Legend:  + = Yes, -  = No; P = Partial, D = Decreased, NV = Not Visualized, NA = Not Examined    Thrombosis                                                                    Legend:  A = Acute, C = Chronic, O = Obstructive, P = Partially Obstructive     ADDITIONAL FINDINGS: No incidental finding of reflux of greater than 500ms noted in the left lower extremity.     IMPRESSION: No evidence of deep or superficial vein thrombosis noted in the left lower extremity.     ASSESSMENT: Lerry Patersonetrella O Miyasaki is a 63 y.o. female who is  s/p  right common femoral artery cannulation under ultrasound guidance, bilateral pelvic angiogram, third order arterial selection, and left leg runoff on 01/22/13. ABI's are normal today, no evidence of arterial occlusive  disease. Left lower extremity arterial Duplex today demonstrates no evidence of significant left lower extremity arterial occlusive disease. Venous Duplex of left leg today found no evidence of deep or superficial vein thrombosis noted in the left lower extremity.  PLAN:  After discussing with Dr. Imogene Burn, it was recommended to patient that she have Duplex of her right leg to evaluate for possible DVT, this showed no evidence of DVT. I also advised her to follow up with her PCP re her enlarged thyroid.  Patient was advised to work closely with her medical provider that helps her manage her DM to get this under control as well as possible. The abnormal sensations she had and continues to have in her left lower leg may be related to diabetic neuropathy. She was counseled re smoking cessation. She takes daily ASA, a statin, and Plavix. Graduated walking program. Follow up in 1 year with ABI's.  I discussed in depth with the patient the nature of atherosclerosis, and emphasized the importance of maximal medical management including strict control of blood pressure, blood glucose, and lipid levels, obtaining regular exercise, and cessation of smoking.  The patient is aware that without maximal medical management the underlying atherosclerotic disease process will progress, limiting the benefit of any interventions. She was counseled re smoking cessation.  The patient was given information about PAD including signs, symptoms, treatment, what symptoms should prompt the patient to seek immediate medical care, and risk reduction measures to take.  Charisse March, RN, MSN, FNP-C [facility] [place] Phone: [phone]  Clinic MD: Imogene Burn  03/12/2014 2:49  PM

## 2014-03-12 NOTE — Progress Notes (Signed)
Patient ID: Gwendolyn Grimes, female   DOB: 06/26/1951, 63 y.o.   MRN: [id number] Pt. was seen per nurse practitioner and was advised to have a venous duplex done of her left lower extremity, today, to further evaluate and r/o DVT.  Pt. Stated she couldn't stay today, due to her daughter needed to get to work, and couldn't wait.  The pt. Said the soonest she would be able to return for the vascular study was 03/26/14.  Suggested that this could be arranged for her close to her home.  Pt. Declined having the venous duplex scheduled in her hometown.  She was adamant she would not have anything done to her in [place].  Advised pt. she should have this study done as soon as possible due to the danger of a blood clot. She stated "I've gotten by this long... I'll be all right."  Made Dr. Imogene Burnhen aware of situation.  Pt. scheduled appt. for Venous duplex 03/26/14.

## 2014-03-12 NOTE — Patient Instructions (Signed)
Peripheral Vascular Disease Peripheral Vascular Disease (PVD), also called Peripheral Arterial Disease (PAD), is a circulation problem caused by cholesterol (atherosclerotic plaque) deposits in the arteries. PVD commonly occurs in the lower extremities (legs) but it can occur in other areas of the body, such as your arms. The cholesterol buildup in the arteries reduces blood flow which can cause pain and other serious problems. The presence of PVD can place a person at risk for Coronary Artery Disease (CAD).  CAUSES  Causes of PVD can be many. It is usually associated with more than one risk factor such as:   High Cholesterol.  Smoking.  Diabetes.  Lack of exercise or inactivity.  High blood pressure (hypertension).  Obesity.  Family history. SYMPTOMS   When the lower extremities are affected, patients with PVD may experience:  Leg pain with exertion or physical activity. This is called INTERMITTENT CLAUDICATION. This may present as cramping or numbness with physical activity. The location of the pain is associated with the level of blockage. For example, blockage at the abdominal level (distal abdominal aorta) may result in buttock or hip pain. Lower leg arterial blockage may result in calf pain.  As PVD becomes more severe, pain can develop with less physical activity.  In people with severe PVD, leg pain may occur at rest.  Other PVD signs and symptoms:  Leg numbness or weakness.  Coldness in the affected leg or foot, especially when compared to the other leg.  A change in leg color.  Patients with significant PVD are more prone to ulcers or sores on toes, feet or legs. These may take longer to heal or may reoccur. The ulcers or sores can become infected.  If signs and symptoms of PVD are ignored, gangrene may occur. This can result in the loss of toes or loss of an entire limb.  Not all leg pain is related to PVD. Other medical conditions can cause leg pain such  as:  Blood clots (embolism) or Deep Vein Thrombosis.  Inflammation of the blood vessels (vasculitis).  Spinal stenosis. DIAGNOSIS  Diagnosis of PVD can involve several different types of tests. These can include:  Pulse Volume Recording Method (PVR). This test is simple, painless and does not involve the use of X-rays. PVR involves measuring and comparing the blood pressure in the arms and legs. An ABI (Ankle-Brachial Index) is calculated. The normal ratio of blood pressures is 1. As this number becomes smaller, it indicates more severe disease.  < 0.95 - indicates significant narrowing in one or more leg vessels.  <0.8 - there will usually be pain in the foot, leg or buttock with exercise.  <0.4 - will usually have pain in the legs at rest.  <0.25 - usually indicates limb threatening PVD.  Doppler detection of pulses in the legs. This test is painless and checks to see if you have a pulses in your legs/feet.  A dye or contrast material (a substance that highlights the blood vessels so they show up on x-ray) may be given to help your caregiver better see the arteries for the following tests. The dye is eliminated from your body by the kidney's. Your caregiver may order blood work to check your kidney function and other laboratory values before the following tests are performed:  Magnetic Resonance Angiography (MRA). An MRA is a picture study of the blood vessels and arteries. The MRA machine uses a large magnet to produce images of the blood vessels.  Computed Tomography Angiography (CTA). A CTA   is a specialized x-ray that looks at how the blood flows in your blood vessels. An IV may be inserted into your arm so contrast dye can be injected.  Angiogram. Is a procedure that uses x-rays to look at your blood vessels. This procedure is minimally invasive, meaning a small incision (cut) is made in your groin. A small tube (catheter) is then inserted into the artery of your groin. The catheter  is guided to the blood vessel or artery your caregiver wants to examine. Contrast dye is injected into the catheter. X-rays are then taken of the blood vessel or artery. After the images are obtained, the catheter is taken out. TREATMENT  Treatment of PVD involves many interventions which may include:  Lifestyle changes:  Quitting smoking.  Exercise.  Following a low fat, low cholesterol diet.  Control of diabetes.  Foot care is very important to the PVD patient. Good foot care can help prevent infection.  Medication:  Cholesterol-lowering medicine.  Blood pressure medicine.  Anti-platelet drugs.  Certain medicines may reduce symptoms of Intermittent Claudication.  Interventional/Surgical options:  Angioplasty. An Angioplasty is a procedure that inflates a balloon in the blocked artery. This opens the blocked artery to improve blood flow.  Stent Implant. A wire mesh tube (stent) is placed in the artery. The stent expands and stays in place, allowing the artery to remain open.  Peripheral Bypass Surgery. This is a surgical procedure that reroutes the blood around a blocked artery to help improve blood flow. This type of procedure may be performed if Angioplasty or stent implants are not an option. SEEK IMMEDIATE MEDICAL CARE IF:   You develop pain or numbness in your arms or legs.  Your arm or leg turns cold, becomes blue in color.  You develop redness, warmth, swelling and pain in your arms or legs. MAKE SURE YOU:   Understand these instructions.  Will watch your condition.  Will get help right away if you are not doing well or get worse. Document Released: 09/06/2004 Document Revised: 10/22/2011 Document Reviewed: 08/03/2008 Exit[employer] Patient Information 2015 [employer]. This information is not intended to replace advice given to you by your health care provider. Make sure you discuss any questions you have with your health care provider.   Smoking  Cessation Quitting smoking is important to your health and has many advantages. However, it is not always easy to quit since nicotine is a very addictive drug. Oftentimes, people try 3 times or more before being able to quit. This document explains the best ways for you to prepare to quit smoking. Quitting takes hard work and a lot of effort, but you can do it. ADVANTAGES OF QUITTING SMOKING  You will live longer, feel better, and live better.  Your body will feel the impact of quitting smoking almost immediately.  Within 20 minutes, blood pressure decreases. Your pulse returns to its normal level.  After 8 hours, carbon monoxide levels in the blood return to normal. Your oxygen level increases.  After 24 hours, the chance of having a heart attack starts to decrease. Your breath, hair, and body stop smelling like smoke.  After 48 hours, damaged nerve endings begin to recover. Your sense of taste and smell improve.  After 72 hours, the body is virtually free of nicotine. Your bronchial tubes relax and breathing becomes easier.  After 2 to 12 weeks, lungs can hold more air. Exercise becomes easier and circulation improves.  The risk of having a heart attack, stroke, cancer,   or lung disease is greatly reduced.  After 1 year, the risk of coronary heart disease is cut in half.  After 5 years, the risk of stroke falls to the same as a nonsmoker.  After 10 years, the risk of lung cancer is cut in half and the risk of other cancers decreases significantly.  After 15 years, the risk of coronary heart disease drops, usually to the level of a nonsmoker.  If you are pregnant, quitting smoking will improve your chances of having a healthy baby.  The people you live with, especially any children, will be healthier.  You will have extra money to spend on things other than cigarettes. QUESTIONS TO THINK ABOUT BEFORE ATTEMPTING TO QUIT You may want to talk about your answers with your health care  provider.  Why do you want to quit?  If you tried to quit in the past, what helped and what did not?  What will be the most difficult situations for you after you quit? How will you plan to handle them?  Who can help you through the tough times? Your family? Friends? A health care provider?  What pleasures do you get from smoking? What ways can you still get pleasure if you quit? Here are some questions to ask your health care provider:  How can you help me to be successful at quitting?  What medicine do you think would be best for me and how should I take it?  What should I do if I need more help?  What is smoking withdrawal like? How can I get information on withdrawal? GET READY  Set a quit date.  Change your environment by getting rid of all cigarettes, ashtrays, matches, and lighters in your home, car, or work. Do not let people smoke in your home.  Review your past attempts to quit. Think about what worked and what did not. GET SUPPORT AND ENCOURAGEMENT You have a better chance of being successful if you have help. You can get support in many ways.  Tell your family, friends, and coworkers that you are going to quit and need their support. Ask them not to smoke around you.  Get individual, group, or telephone counseling and support. Programs are available at local hospitals and health centers. Call your local health department for information about programs in your area.  Spiritual beliefs and practices may help some smokers quit.  Download a "quit meter" on your computer to keep track of quit statistics, such as how long you have gone without smoking, cigarettes not smoked, and money saved.  Get a self-help book about quitting smoking and staying off tobacco. LEARN NEW SKILLS AND BEHAVIORS  Distract yourself from urges to smoke. Talk to someone, go for a walk, or occupy your time with a task.  Change your normal routine. Take a different route to work. Drink tea  instead of coffee. Eat breakfast in a different place.  Reduce your stress. Take a hot bath, exercise, or read a book.  Plan something enjoyable to do every day. Reward yourself for not smoking.  Explore interactive web-based programs that specialize in helping you quit. GET MEDICINE AND USE IT CORRECTLY Medicines can help you stop smoking and decrease the urge to smoke. Combining medicine with the above behavioral methods and support can greatly increase your chances of successfully quitting smoking.  Nicotine replacement therapy helps deliver nicotine to your body without the negative effects and risks of smoking. Nicotine replacement therapy includes nicotine gum, lozenges, inhalers,   nasal sprays, and skin patches. Some may be available over-the-counter and others require a prescription.  Antidepressant medicine helps people abstain from smoking, but how this works is unknown. This medicine is available by prescription.  Nicotinic receptor partial agonist medicine simulates the effect of nicotine in your brain. This medicine is available by prescription. Ask your health care provider for advice about which medicines to use and how to use them based on your health history. Your health care provider will tell you what side effects to look out for if you choose to be on a medicine or therapy. Carefully read the information on the package. Do not use any other product containing nicotine while using a nicotine replacement product.  RELAPSE OR DIFFICULT SITUATIONS Most relapses occur within the first 3 months after quitting. Do not be discouraged if you start smoking again. Remember, most people try several times before finally quitting. You may have symptoms of withdrawal because your body is used to nicotine. You may crave cigarettes, be irritable, feel very hungry, cough often, get headaches, or have difficulty concentrating. The withdrawal symptoms are only temporary. They are strongest when you  first quit, but they will go away within 10-14 days. To reduce the chances of relapse, try to:  Avoid drinking alcohol. Drinking lowers your chances of successfully quitting.  Reduce the amount of caffeine you consume. Once you quit smoking, the amount of caffeine in your body increases and can give you symptoms, such as a rapid heartbeat, sweating, and anxiety.  Avoid smokers because they can make you want to smoke.  Do not let weight gain distract you. Many smokers will gain weight when they quit, usually less than 10 pounds. Eat a healthy diet and stay active. You can always lose the weight gained after you quit.  Find ways to improve your mood other than smoking. FOR MORE INFORMATION  www.smokefree.gov  Document Released: 07/24/2001 Document Revised: 12/14/2013 Document Reviewed: 11/08/2011 [employer] Patient Information 2015 [employer]. This information is not intended to replace advice given to you by your health care provider. Make sure you discuss any questions you have with your health care provider.  

## 2014-03-15 ENCOUNTER — Telehealth: Payer: Self-pay | Admitting: Family

## 2014-03-15 NOTE — Telephone Encounter (Signed)
Note to Charisse MarchSuzanne Nickel, NP:   Rosalita ChessmanSuzanne,  I just wanted to give you the updated information on Gwendolyn Grimes. I spoke with her earlier this afternoon and she decided to have her [facility] Ven[facility] duplex tomorrow, [facility]/14 [facility] [place]. I have requested a call report.

## 2014-03-16 ENCOUNTER — Telehealth: Payer: Self-pay | Admitting: *Deleted

## 2014-03-16 NOTE — Telephone Encounter (Signed)
Called report for Ms. Gwendolyn Grimes:  [facility] [phone];  Negative venous doppler for any DVT or superficial venous problems. Report to be mailed to us asap.

## 2014-03-26 ENCOUNTER — Other Ambulatory Visit (HOSPITAL_COMMUNITY): Payer: PRIVATE HEALTH INSURANCE

## 2014-07-22 ENCOUNTER — Encounter (HOSPITAL_COMMUNITY): Payer: Self-pay | Admitting: Vascular Surgery

## 2017-04-13 DEATH — deceased
